# Patient Record
Sex: Female | Born: 1985
Health system: Southern US, Community
[De-identification: ages and names within clinical notes are randomized; demographics above are authoritative.]

## PROBLEM LIST (undated history)

## (undated) DIAGNOSIS — J45909 Unspecified asthma, uncomplicated: Secondary | ICD-10-CM

## (undated) DIAGNOSIS — B009 Herpesviral infection, unspecified: Secondary | ICD-10-CM

## (undated) DIAGNOSIS — I2699 Other pulmonary embolism without acute cor pulmonale: Secondary | ICD-10-CM

## (undated) DIAGNOSIS — O139 Gestational [pregnancy-induced] hypertension without significant proteinuria, unspecified trimester: Secondary | ICD-10-CM

## (undated) HISTORY — DX: Gestational (pregnancy-induced) hypertension without significant proteinuria, unspecified trimester: O13.9

## (undated) HISTORY — DX: Herpesviral infection, unspecified: B00.9

## (undated) HISTORY — PX: NO PAST SURGERIES: SHX2092

---

## 2007-08-10 ENCOUNTER — Emergency Department (HOSPITAL_COMMUNITY): Admission: EM | Admit: 2007-08-10 | Discharge: 2007-08-10 | Payer: Self-pay | Admitting: *Deleted

## 2007-11-08 ENCOUNTER — Emergency Department (HOSPITAL_COMMUNITY): Admission: EM | Admit: 2007-11-08 | Discharge: 2007-11-08 | Payer: Self-pay | Admitting: Emergency Medicine

## 2009-01-01 ENCOUNTER — Emergency Department (HOSPITAL_COMMUNITY): Admission: EM | Admit: 2009-01-01 | Discharge: 2009-01-02 | Payer: Self-pay | Admitting: Emergency Medicine

## 2009-11-11 ENCOUNTER — Emergency Department (HOSPITAL_COMMUNITY): Admission: EM | Admit: 2009-11-11 | Discharge: 2009-11-11 | Payer: Self-pay | Admitting: Emergency Medicine

## 2010-01-14 ENCOUNTER — Emergency Department (HOSPITAL_COMMUNITY): Admission: EM | Admit: 2010-01-14 | Discharge: 2010-01-14 | Payer: Self-pay | Admitting: Family Medicine

## 2010-03-25 ENCOUNTER — Emergency Department (HOSPITAL_COMMUNITY): Admission: EM | Admit: 2010-03-25 | Discharge: 2010-03-25 | Payer: Self-pay | Admitting: Emergency Medicine

## 2010-12-13 LAB — URINALYSIS, ROUTINE W REFLEX MICROSCOPIC
Bilirubin Urine: NEGATIVE
Glucose, UA: NEGATIVE mg/dL
Nitrite: NEGATIVE
Protein, ur: NEGATIVE mg/dL

## 2010-12-13 LAB — WET PREP, GENITAL: Trich, Wet Prep: NONE SEEN

## 2010-12-13 LAB — RPR: RPR Ser Ql: NONREACTIVE

## 2010-12-13 LAB — POCT PREGNANCY, URINE: Preg Test, Ur: NEGATIVE

## 2010-12-13 LAB — URINE MICROSCOPIC-ADD ON

## 2010-12-15 LAB — POCT URINALYSIS DIP (DEVICE)
Nitrite: POSITIVE — AB
Specific Gravity, Urine: >=1.03 (ref 1.005–1.030)
Urobilinogen, UA: 0.2 mg/dL (ref 0.0–1.0)
pH: 6 (ref 5.0–8.0)

## 2010-12-15 LAB — POCT PREGNANCY, URINE: Preg Test, Ur: NEGATIVE

## 2010-12-15 LAB — URINE CULTURE: Colony Count: >=100000

## 2012-02-16 ENCOUNTER — Other Ambulatory Visit (HOSPITAL_COMMUNITY)
Admission: RE | Admit: 2012-02-16 | Discharge: 2012-02-16 | Disposition: A | Discharge status: Home / Self Care | Payer: BC Managed Care – PPO | Source: Ambulatory Visit | Attending: Obstetrics & Gynecology | Admitting: Obstetrics & Gynecology

## 2012-02-16 ENCOUNTER — Other Ambulatory Visit: Payer: Self-pay | Admitting: Obstetrics & Gynecology

## 2012-02-16 DIAGNOSIS — Z113 Encounter for screening for infections with a predominantly sexual mode of transmission: Secondary | ICD-10-CM | POA: Insufficient documentation

## 2012-02-16 DIAGNOSIS — Z01419 Encounter for gynecological examination (general) (routine) without abnormal findings: Secondary | ICD-10-CM | POA: Insufficient documentation

## 2012-03-08 ENCOUNTER — Encounter: Payer: Self-pay | Admitting: Obstetrics & Gynecology

## 2012-08-16 ENCOUNTER — Encounter (HOSPITAL_BASED_OUTPATIENT_CLINIC_OR_DEPARTMENT_OTHER): Payer: Self-pay | Admitting: Emergency Medicine

## 2012-08-16 ENCOUNTER — Emergency Department (HOSPITAL_BASED_OUTPATIENT_CLINIC_OR_DEPARTMENT_OTHER)
Admission: EM | Admit: 2012-08-16 | Discharge: 2012-08-16 | Disposition: A | Discharge status: Home / Self Care | Payer: BC Managed Care – PPO | Attending: Emergency Medicine | Admitting: Emergency Medicine

## 2012-08-16 DIAGNOSIS — H5789 Other specified disorders of eye and adnexa: Secondary | ICD-10-CM | POA: Insufficient documentation

## 2012-08-16 DIAGNOSIS — R05 Cough: Secondary | ICD-10-CM | POA: Insufficient documentation

## 2012-08-16 DIAGNOSIS — R1110 Vomiting, unspecified: Secondary | ICD-10-CM | POA: Insufficient documentation

## 2012-08-16 DIAGNOSIS — R059 Cough, unspecified: Secondary | ICD-10-CM | POA: Insufficient documentation

## 2012-08-16 DIAGNOSIS — Z87891 Personal history of nicotine dependence: Secondary | ICD-10-CM | POA: Insufficient documentation

## 2012-08-16 DIAGNOSIS — R233 Spontaneous ecchymoses: Secondary | ICD-10-CM | POA: Insufficient documentation

## 2012-08-16 DIAGNOSIS — R509 Fever, unspecified: Secondary | ICD-10-CM | POA: Insufficient documentation

## 2012-08-16 DIAGNOSIS — J45909 Unspecified asthma, uncomplicated: Secondary | ICD-10-CM | POA: Insufficient documentation

## 2012-08-16 HISTORY — DX: Unspecified asthma, uncomplicated: J45.909

## 2012-08-16 NOTE — ED Provider Notes (Signed)
History     CSN: [id number]  Arrival date & time 08/16/12  1914   First MD Initiated Contact with Patient 08/16/12 1932      Chief Complaint  Patient presents with  . Eye Problem    (Consider location/radiation/quality/duration/timing/severity/associated sxs/prior treatment) HPI Pt reports last night she at some seafood and then had a short episode of vomiting which resolved before going to bed. She has had a mild fever today and persistent dry cough. Co-workers noticed some redness around her eyes bilaterally and recommended she come to the ED. She denies any pain, no drainage, no blurry vision. Other than the mild cough she has been feeling well today.   Past Medical History  Diagnosis Date  . Asthma     History reviewed. No pertinent past surgical history.  No family history on file.  History  Substance Use Topics  . Smoking status: Former Games developer  . Smokeless tobacco: Not on file  . Alcohol Use: Yes    OB History    Grav Para Term Preterm Abortions TAB SAB Ect Mult Living                  Review of Systems All other systems reviewed and are negative except as noted in HPI.   Allergies  Review of patient's allergies indicates no known allergies.  Home Medications  No current outpatient prescriptions on file.  BP 138/85  Pulse 93  Temp 99 F (37.2 C) (Oral)  Resp 16  Ht 5\' 2"  (1.575 m)  Wt 219 lb (99.338 kg)  BMI 40.06 kg/m2  SpO2 99%  LMP 08/14/2012  Physical Exam  Nursing note and vitals reviewed. Constitutional: She is oriented to person, place, and time. She appears well-developed and well-nourished.  HENT:  Head: Normocephalic and atraumatic.  Eyes: EOM are normal. Pupils are equal, round, and reactive to light.       Mild periorbital ecchymosis/petechiae bilaterally, normal globes, no conjunctivitis, anterior chambers are normal  Neck: Normal range of motion. Neck supple.  Cardiovascular: Normal rate, normal heart sounds and intact distal  pulses.   Pulmonary/Chest: Effort normal and breath sounds normal.  Abdominal: Bowel sounds are normal. She exhibits no distension. There is no tenderness.  Musculoskeletal: Normal range of motion. She exhibits no edema and no tenderness.  Neurological: She is alert and oriented to person, place, and time. She has normal strength. No cranial nerve deficit or sensory deficit.  Skin: Skin is warm and dry. No rash noted.  Psychiatric: She has a normal mood and affect.    ED Course  Procedures (including critical care time)  Labs Reviewed - No data to display No results found.   No diagnosis found.    MDM  Periorbital petechiae likely from vomiting and/or coughing. No concern for periorbital cellulitis, pt advised to return for any worsening redness, swelling, pain, or for any other concerns.         Hawkin Charo B. Bernette Mayers, MD 08/16/12 1941

## 2012-08-16 NOTE — ED Notes (Signed)
Pt sts her coworkers noticed her eyes looking red today.  Sts she got some delivery seafood last night and she vomited 5  Min after eating it.  No other vomiting or issues at that time.  Pt concerned about the redness in her eyes today.

## 2013-02-04 ENCOUNTER — Emergency Department (HOSPITAL_COMMUNITY)
Admission: EM | Admit: 2013-02-04 | Discharge: 2013-02-04 | Disposition: A | Discharge status: Home / Self Care | Payer: Self-pay | Attending: Emergency Medicine | Admitting: Emergency Medicine

## 2013-02-04 ENCOUNTER — Encounter (HOSPITAL_COMMUNITY): Payer: Self-pay | Admitting: *Deleted

## 2013-02-04 DIAGNOSIS — N760 Acute vaginitis: Secondary | ICD-10-CM | POA: Insufficient documentation

## 2013-02-04 DIAGNOSIS — Z79899 Other long term (current) drug therapy: Secondary | ICD-10-CM | POA: Insufficient documentation

## 2013-02-04 DIAGNOSIS — A499 Bacterial infection, unspecified: Secondary | ICD-10-CM | POA: Insufficient documentation

## 2013-02-04 DIAGNOSIS — R209 Unspecified disturbances of skin sensation: Secondary | ICD-10-CM | POA: Insufficient documentation

## 2013-02-04 DIAGNOSIS — Z87891 Personal history of nicotine dependence: Secondary | ICD-10-CM | POA: Insufficient documentation

## 2013-02-04 DIAGNOSIS — J45909 Unspecified asthma, uncomplicated: Secondary | ICD-10-CM | POA: Insufficient documentation

## 2013-02-04 DIAGNOSIS — Z3202 Encounter for pregnancy test, result negative: Secondary | ICD-10-CM | POA: Insufficient documentation

## 2013-02-04 DIAGNOSIS — B9689 Other specified bacterial agents as the cause of diseases classified elsewhere: Secondary | ICD-10-CM | POA: Insufficient documentation

## 2013-02-04 LAB — URINALYSIS, ROUTINE W REFLEX MICROSCOPIC
Bilirubin Urine: NEGATIVE
Glucose, UA: NEGATIVE mg/dL
Hgb urine dipstick: NEGATIVE
Leukocytes, UA: NEGATIVE

## 2013-02-04 LAB — WET PREP, GENITAL: Yeast Wet Prep HPF POC: NONE SEEN

## 2013-02-04 LAB — POCT PREGNANCY, URINE: Preg Test, Ur: NEGATIVE

## 2013-02-04 MED ORDER — METRONIDAZOLE 500 MG PO TABS: 500.0000 mg | ORAL_TABLET | Freq: Two times a day (BID) | ORAL | Status: DC

## 2013-02-04 NOTE — ED Notes (Signed)
Pt self treated with monistat for yeast infection.  Pt states that she took fluconazole, but continues to have vaginal tingling

## 2013-02-04 NOTE — ED Provider Notes (Signed)
History     CSN: [id number]  Arrival date & time 02/04/13  1404   First MD Initiated Contact with Patient 02/04/13 1503      Chief Complaint  Patient presents with  . Vaginal Discharge    (Consider location/radiation/quality/duration/timing/severity/associated sxs/prior treatment) HPI Comments: Patient presents with a chief complaint of vaginal discharge and tingling in the vaginal area.  She reports that her symptoms have been present for the past month.  Initially she had a "thick cottage cheese" like discharge.  She then took a 3 day course of OTC Monistat and feels that her symptoms improved somewhat.  However, she continues to have vaginal irritation and a "tingling" feeling.  She called her PCP two weeks ago and her PCP called her in a prescription for Fluconazole, which she has been taking for the past six days.  She is currently sexually active.  No new sexual partners.  No prior history of STD's.  She denies any abdominal or pelvic pain.  Denies nausea or vomiting.  Denies fever or chills.  LMP was 01/24/13.  Patient is a 27 y.o. female presenting with vaginal discharge. The history is provided by the patient.  Vaginal Discharge Pertinent negatives include no abdominal pain, chills, fever, nausea, rash, urinary symptoms or vomiting.    Past Medical History  Diagnosis Date  . Asthma     History reviewed. No pertinent past surgical history.  No family history on file.  History  Substance Use Topics  . Smoking status: Former Games developer  . Smokeless tobacco: Not on file  . Alcohol Use: Yes    OB History   Grav Para Term Preterm Abortions TAB SAB Ect Mult Living                  Review of Systems  Constitutional: Negative for fever and chills.  Gastrointestinal: Negative for nausea, vomiting and abdominal pain.  Genitourinary: Positive for vaginal discharge. Negative for vaginal bleeding and pelvic pain.  Skin: Negative for rash.  All other systems reviewed and are  negative.    Allergies  Review of patient's allergies indicates no known allergies.  Home Medications   Current Outpatient Rx  Name  Route  Sig  Dispense  Refill  . acetaminophen (TYLENOL) 500 MG tablet   Oral   Take 1,000 mg by mouth 2 (two) times daily as needed for pain.         . fluconazole (DIFLUCAN) 150 MG tablet   Oral   Take 150 mg by mouth daily. For 10 days, pt started on 01/29/13         . albuterol (PROVENTIL HFA;VENTOLIN HFA) 108 (90 BASE) MCG/ACT inhaler   Inhalation   Inhale 2 puffs into the lungs every 6 (six) hours as needed for wheezing or shortness of breath.           BP 160/95  Pulse 105  Temp(Src) 98.3 F (36.8 C) (Oral)  Resp 18  SpO2 100%  LMP 01/24/2013  Physical Exam  Nursing note and vitals reviewed. Constitutional: She appears well-developed and well-nourished.  HENT:  Head: Normocephalic and atraumatic.  Mouth/Throat: Oropharynx is clear and moist.  Neck: Normal range of motion. Neck supple.  Cardiovascular: Normal rate, regular rhythm and normal heart sounds.   Pulmonary/Chest: Effort normal and breath sounds normal.  Abdominal: Soft. Bowel sounds are normal. She exhibits no distension and no mass. There is no tenderness. There is no rebound and no guarding.  Genitourinary: There is no rash or  lesion on the right labia. There is no rash or lesion on the left labia. Cervix exhibits no motion tenderness. Right adnexum displays no mass, no tenderness and no fullness. Left adnexum displays no mass, no tenderness and no fullness.  Yellowish colored discharge in the vaginal vault  Neurological: She is alert.  Skin: Skin is warm and dry.  Psychiatric: She has a normal mood and affect.    ED Course  Procedures (including critical care time)  Labs Reviewed  GC/CHLAMYDIA PROBE AMP  WET PREP, GENITAL  URINALYSIS, ROUTINE W REFLEX MICROSCOPIC  POCT PREGNANCY, URINE   No results found.   No diagnosis found.    MDM  Patient  presenting with vaginal discharge.  No pelvic pain.  No CMT on exam.  Wet prep showing BV.  Patient treated with Flagyl.  GC/Chlamydia pending.  Urine pregnancy negative.  Patient stable for discharge.  Return precautions given.        Pascal Lux [place], PA-C 02/05/13 1217

## 2013-02-04 NOTE — ED Notes (Addendum)
Pt states she used monistat a few weeks ago for a yeast infection, now using flucanizole for yeast infection, but is still complaining of vaginal tingling and itching. Denies discharge. Denies pain. Denies n/v, fever. Denies urinary symptoms.

## 2013-02-06 NOTE — ED Provider Notes (Signed)
Medical screening examination/treatment/procedure(s) were performed by non-physician practitioner and as supervising physician I was immediately available for consultation/collaboration.   Chriss Redel, MD 02/06/13 1544 

## 2014-09-27 DIAGNOSIS — Z87891 Personal history of nicotine dependence: Secondary | ICD-10-CM | POA: Diagnosis not present

## 2014-09-27 DIAGNOSIS — Z3202 Encounter for pregnancy test, result negative: Secondary | ICD-10-CM | POA: Insufficient documentation

## 2014-09-27 DIAGNOSIS — R102 Pelvic and perineal pain: Secondary | ICD-10-CM | POA: Diagnosis not present

## 2014-09-27 DIAGNOSIS — M545 Low back pain: Secondary | ICD-10-CM | POA: Insufficient documentation

## 2014-09-27 DIAGNOSIS — R51 Headache: Secondary | ICD-10-CM | POA: Insufficient documentation

## 2014-09-27 DIAGNOSIS — J45909 Unspecified asthma, uncomplicated: Secondary | ICD-10-CM | POA: Diagnosis not present

## 2014-09-27 DIAGNOSIS — Z79899 Other long term (current) drug therapy: Secondary | ICD-10-CM | POA: Insufficient documentation

## 2014-09-28 ENCOUNTER — Encounter (HOSPITAL_COMMUNITY): Payer: Self-pay | Admitting: *Deleted

## 2014-09-28 ENCOUNTER — Emergency Department (HOSPITAL_COMMUNITY)
Admission: EM | Admit: 2014-09-28 | Discharge: 2014-09-28 | Disposition: A | Discharge status: Home / Self Care | Payer: BC Managed Care – PPO | Attending: Emergency Medicine | Admitting: Emergency Medicine

## 2014-09-28 DIAGNOSIS — R102 Pelvic and perineal pain: Secondary | ICD-10-CM

## 2014-09-28 LAB — URINALYSIS, ROUTINE W REFLEX MICROSCOPIC
BILIRUBIN URINE: NEGATIVE
Glucose, UA: NEGATIVE mg/dL
Hgb urine dipstick: NEGATIVE
KETONES UR: NEGATIVE mg/dL
LEUKOCYTES UA: NEGATIVE
NITRITE: NEGATIVE
Protein, ur: NEGATIVE mg/dL
Specific Gravity, Urine: 1.025 (ref 1.005–1.030)
Urobilinogen, UA: 1 mg/dL (ref 0.0–1.0)
pH: 7 (ref 5.0–8.0)

## 2014-09-28 LAB — POC URINE PREG, ED: PREG TEST UR: NEGATIVE

## 2014-09-28 LAB — WET PREP, GENITAL
Trich, Wet Prep: NONE SEEN
Yeast Wet Prep HPF POC: NONE SEEN

## 2014-09-28 MED ORDER — FLUCONAZOLE 100 MG PO TABS
150.0000 mg | ORAL_TABLET | Freq: Once | ORAL | Status: AC
  Administered 2014-09-28: 150 mg via ORAL
  Filled 2014-09-28: qty 2

## 2014-09-28 NOTE — ED Provider Notes (Signed)
CSN: [id number]     Arrival date & time 09/27/14  2348 History   First MD Initiated Contact with Patient 09/28/14 0121     This chart was scribed for Fredia Sorrow, MD by Forrestine Him, ED Scribe. This patient was seen in room D32C/D32C and the patient's care was started 5:10 AM.   Chief Complaint  Patient presents with  . Vaginal Pain   Patient is a 29 y.o. female presenting with vaginal pain. The history is provided by the patient. No language interpreter was used.  Vaginal Pain This is a new problem. The current episode started more than 1 week ago. The problem occurs daily. The problem has not changed since onset.Associated symptoms include headaches. Pertinent negatives include no chest pain, no abdominal pain and no shortness of breath. Nothing aggravates the symptoms. Nothing relieves the symptoms. She has tried nothing for the symptoms.    HPI Comments: Wanda Fuller is a 29 y.o. female with a PMHx of asthma who presents to the Emergency Department complaining of intermittent vaginal pain x 1 month that is unchanged. She describes pain as tingling to her vulva. No recent vaginal discharge,fever, dysuria,or chills. Pt has mentioned concern to PCP without any abnormal finds on exam. LNMP 09/14/2014. No known allergies to medications.  Past Medical History  Diagnosis Date  . Asthma    History reviewed. No pertinent past surgical history. No family history on file. History  Substance Use Topics  . Smoking status: Former Research scientist (life sciences)  . Smokeless tobacco: Never Used  . Alcohol Use: Yes   OB History    No data available     Review of Systems  Constitutional: Negative for fever and chills.  HENT: Negative for congestion, rhinorrhea and sore throat.   Eyes: Negative for visual disturbance.  Respiratory: Negative for cough and shortness of breath.   Cardiovascular: Negative for chest pain and leg swelling.  Gastrointestinal: Negative for nausea, vomiting and abdominal pain.   Genitourinary: Positive for vaginal pain. Negative for vaginal discharge.  Musculoskeletal: Positive for back pain.  Skin: Negative for rash.  Neurological: Positive for headaches.  Psychiatric/Behavioral: Negative for confusion.      Allergies  Review of patient's allergies indicates no known allergies.  Home Medications   Prior to Admission medications   Medication Sig Start Date End Date Taking? Authorizing Provider  acetaminophen (TYLENOL) 500 MG tablet Take 1,000 mg by mouth 2 (two) times daily as needed for pain.    Historical Provider, MD  albuterol (PROVENTIL HFA;VENTOLIN HFA) 108 (90 BASE) MCG/ACT inhaler Inhale 2 puffs into the lungs every 6 (six) hours as needed for wheezing or shortness of breath.    Historical Provider, MD  fluconazole (DIFLUCAN) 150 MG tablet Take 150 mg by mouth daily. For 10 days, pt started on 01/29/13    Historical Provider, MD  metroNIDAZOLE (FLAGYL) 500 MG tablet Take 1 tablet (500 mg total) by mouth 2 (two) times daily. Patient not taking: Reported on 09/28/2014 02/04/13   Hyman Bible, PA-C   Triage Vitals: BP 121/72 mmHg  Pulse 82  Temp(Src) 98.2 F (36.8 C) (Oral)  Resp 16  Ht 5\' 2"  (1.575 m)  Wt 225 lb (102.059 kg)  BMI 41.14 kg/m2  SpO2 100%  LMP 09/14/2014   Physical Exam  Constitutional: She is oriented to person, place, and time. She appears well-developed and well-nourished. No distress.  HENT:  Head: Normocephalic and atraumatic.  Mouth/Throat: Oropharynx is clear and moist.  Eyes: Conjunctivae and EOM are normal. Pupils  are equal, round, and reactive to light.  Sclera clear  Neck: Normal range of motion.  Cardiovascular: Normal rate, regular rhythm and normal heart sounds.   No murmur heard. Pulmonary/Chest: Effort normal and breath sounds normal.  Abdominal: Soft. She exhibits no distension. There is no tenderness.  Genitourinary: Uterus normal. Vaginal discharge found.  Whitish vaginal discharge consistent with yeast  infection. No cervical motion tenderness. No external vulvar abnormalities. No lesions no irritation.  Musculoskeletal: Normal range of motion. She exhibits no edema.  Neurological: She is alert and oriented to person, place, and time. No cranial nerve deficit. She exhibits normal muscle tone. Coordination normal.  Skin: Skin is warm and dry.  Psychiatric: She has a normal mood and affect. Judgment normal.  Nursing note and vitals reviewed.   ED Course  Procedures (including critical care time)  DIAGNOSTIC STUDIES: Oxygen Saturation is 100% on RA, Normal by my interpretation.    COORDINATION OF CARE: 5:10 AM-Discussed treatment plan with pt at bedside and pt agreed to plan.     Labs Review Labs Reviewed  WET PREP, GENITAL - Abnormal; Notable for the following:    Clue Cells Wet Prep HPF POC FEW (*)    WBC, Wet Prep HPF POC FEW (*)    All other components within normal limits  URINALYSIS, ROUTINE W REFLEX MICROSCOPIC - Abnormal; Notable for the following:    APPearance CLOUDY (*)    All other components within normal limits  GC/CHLAMYDIA PROBE AMP  HIV ANTIBODY (ROUTINE TESTING)  POC URINE PREG, ED   Results for orders placed or performed during the hospital encounter of 09/28/14  Wet prep, genital  Result Value Ref Range   Yeast Wet Prep HPF POC NONE SEEN NONE SEEN   Trich, Wet Prep NONE SEEN NONE SEEN   Clue Cells Wet Prep HPF POC FEW (A) NONE SEEN   WBC, Wet Prep HPF POC FEW (A) NONE SEEN  Urinalysis, Routine w reflex microscopic  Result Value Ref Range   Color, Urine YELLOW YELLOW   APPearance CLOUDY (A) CLEAR   Specific Gravity, Urine 1.025 1.005 - 1.030   pH 7.0 5.0 - 8.0   Glucose, UA NEGATIVE NEGATIVE mg/dL   Hgb urine dipstick NEGATIVE NEGATIVE   Bilirubin Urine NEGATIVE NEGATIVE   Ketones, ur NEGATIVE NEGATIVE mg/dL   Protein, ur NEGATIVE NEGATIVE mg/dL   Urobilinogen, UA 1.0 0.0 - 1.0 mg/dL   Nitrite NEGATIVE NEGATIVE   Leukocytes, UA NEGATIVE NEGATIVE   POC Urine Pregnancy, ED (do NOT order at [facility])  Result Value Ref Range   Preg Test, Ur NEGATIVE NEGATIVE     Imaging Review No results found.   EKG Interpretation None      MDM   Final diagnoses:  Vaginal pain    No explanation for the patient's complaint of pelvic discomfort. Pelvic exam showed no abnormalities. There was a whitish discharge consistent with yeast however wet prep was negative. Patient given Diflucan here in the emergency department one time dose. Patient will follow-up with her doctor. Patient is nontoxic no acute distress. Pelvic cultures are pending. There was no uterine or adnexal tenderness.  I personally performed the services described in this documentation, which was scribed in my presence. The recorded information has been reviewed and is accurate.    Fredia Sorrow, MD 09/28/14 [phone]

## 2014-09-28 NOTE — ED Notes (Signed)
Patient presents stating she has been having a "strange feeling"   To her vulva and clit.  Does not know if i could be from over use of a vibrator

## 2014-09-28 NOTE — Discharge Instructions (Signed)
Diflucan provided in the emergency department for discharge to treat any possible yeast infection. Cultures are pending. Pelvic exam without any explanation for the pain. Follow-up with your doctor.

## 2014-09-29 LAB — HIV ANTIBODY (ROUTINE TESTING W REFLEX): HIV 1&2 Ab, 4th Generation: NONREACTIVE

## 2014-10-02 LAB — GC/CHLAMYDIA PROBE AMP
CT Probe RNA: NEGATIVE
GC Probe RNA: NEGATIVE

## 2016-09-17 ENCOUNTER — Encounter (HOSPITAL_BASED_OUTPATIENT_CLINIC_OR_DEPARTMENT_OTHER): Payer: Self-pay | Admitting: *Deleted

## 2016-09-17 ENCOUNTER — Emergency Department (HOSPITAL_BASED_OUTPATIENT_CLINIC_OR_DEPARTMENT_OTHER): Payer: BLUE CROSS/BLUE SHIELD

## 2016-09-17 ENCOUNTER — Inpatient Hospital Stay (HOSPITAL_BASED_OUTPATIENT_CLINIC_OR_DEPARTMENT_OTHER)
Admission: EM | Admit: 2016-09-17 | Discharge: 2016-09-19 | DRG: 175 | Disposition: A | Discharge status: Home / Self Care | Payer: BLUE CROSS/BLUE SHIELD | Attending: Internal Medicine | Admitting: Internal Medicine

## 2016-09-17 DIAGNOSIS — J189 Pneumonia, unspecified organism: Secondary | ICD-10-CM | POA: Diagnosis present

## 2016-09-17 DIAGNOSIS — Z6841 Body Mass Index (BMI) 40.0 and over, adult: Secondary | ICD-10-CM

## 2016-09-17 DIAGNOSIS — D1803 Hemangioma of intra-abdominal structures: Secondary | ICD-10-CM | POA: Diagnosis present

## 2016-09-17 DIAGNOSIS — I2699 Other pulmonary embolism without acute cor pulmonale: Principal | ICD-10-CM | POA: Diagnosis present

## 2016-09-17 DIAGNOSIS — M7989 Other specified soft tissue disorders: Secondary | ICD-10-CM | POA: Diagnosis not present

## 2016-09-17 DIAGNOSIS — R0600 Dyspnea, unspecified: Secondary | ICD-10-CM | POA: Diagnosis not present

## 2016-09-17 DIAGNOSIS — J181 Lobar pneumonia, unspecified organism: Secondary | ICD-10-CM

## 2016-09-17 DIAGNOSIS — D1809 Hemangioma of other sites: Secondary | ICD-10-CM | POA: Diagnosis present

## 2016-09-17 DIAGNOSIS — J45909 Unspecified asthma, uncomplicated: Secondary | ICD-10-CM | POA: Diagnosis present

## 2016-09-17 DIAGNOSIS — Z87891 Personal history of nicotine dependence: Secondary | ICD-10-CM

## 2016-09-17 LAB — CBC
HCT: 38.7 % (ref 36.0–46.0)
Hemoglobin: 12.8 g/dL (ref 12.0–15.0)
MCH: 30.2 pg (ref 26.0–34.0)
MCHC: 33.1 g/dL (ref 30.0–36.0)
MCV: 91.3 fL (ref 78.0–100.0)
Platelets: 223 10*3/uL (ref 150–400)
RBC: 4.24 MIL/uL (ref 3.87–5.11)
RDW: 13.4 % (ref 11.5–15.5)
WBC: 9.4 10*3/uL (ref 4.0–10.5)

## 2016-09-17 LAB — BASIC METABOLIC PANEL
Anion gap: 8 (ref 5–15)
BUN: 10 mg/dL (ref 6–20)
CHLORIDE: 103 mmol/L (ref 101–111)
CO2: 25 mmol/L (ref 22–32)
Calcium: 9.9 mg/dL (ref 8.9–10.3)
Creatinine, Ser: 0.65 mg/dL (ref 0.44–1.00)
GFR calc Af Amer: >60 mL/min (ref >60)
GFR calc non Af Amer: >60 mL/min (ref >60)
GLUCOSE: 93 mg/dL (ref 65–99)
POTASSIUM: 3.6 mmol/L (ref 3.5–5.1)
Sodium: 136 mmol/L (ref 135–145)

## 2016-09-17 LAB — TROPONIN I: Troponin I: <0.03 ng/mL (ref <0.03)

## 2016-09-17 LAB — D-DIMER, QUANTITATIVE: D-Dimer, Quant: 1.08 ug/mL-FEU — ABNORMAL HIGH (ref 0.00–0.50)

## 2016-09-17 LAB — HEPARIN LEVEL (UNFRACTIONATED): Heparin Unfractionated: 0.37 IU/mL (ref 0.30–0.70)

## 2016-09-17 MED ORDER — HYDROMORPHONE HCL 1 MG/ML IJ SOLN
1.0000 mg | INTRAMUSCULAR | Status: AC | PRN
  Administered 2016-09-17 – 2016-09-18 (×3): 1 mg via INTRAVENOUS
  Filled 2016-09-17 (×3): qty 1

## 2016-09-17 MED ORDER — IOPAMIDOL (ISOVUE-370) INJECTION 76%
100.0000 mL | Freq: Once | INTRAVENOUS | Status: AC | PRN
  Administered 2016-09-17: 100 mL via INTRAVENOUS

## 2016-09-17 MED ORDER — AZITHROMYCIN 500 MG IV SOLR
INTRAVENOUS | Status: AC
  Filled 2016-09-17: qty 500

## 2016-09-17 MED ORDER — HEPARIN (PORCINE) IN NACL 100-0.45 UNIT/ML-% IJ SOLN
1550.0000 [IU]/h | INTRAMUSCULAR | Status: DC
  Administered 2016-09-17: 1300 [IU]/h via INTRAVENOUS
  Administered 2016-09-18: 1550 [IU]/h via INTRAVENOUS
  Filled 2016-09-17 (×2): qty 250

## 2016-09-17 MED ORDER — HYDROCODONE-ACETAMINOPHEN 5-325 MG PO TABS
1.0000 | ORAL_TABLET | Freq: Four times a day (QID) | ORAL | Status: DC | PRN
  Administered 2016-09-18 – 2016-09-19 (×6): 1 via ORAL
  Filled 2016-09-17 (×7): qty 1

## 2016-09-17 MED ORDER — ONDANSETRON HCL 4 MG PO TABS: 4.0000 mg | ORAL_TABLET | Freq: Four times a day (QID) | ORAL | Status: DC | PRN

## 2016-09-17 MED ORDER — ACETAMINOPHEN 650 MG RE SUPP: 650.0000 mg | Freq: Four times a day (QID) | RECTAL | Status: DC | PRN

## 2016-09-17 MED ORDER — WHITE PETROLATUM GEL
Status: AC
  Administered 2016-09-18: 1
  Filled 2016-09-17: qty 1

## 2016-09-17 MED ORDER — DEXTROSE 5 % IV SOLN
500.0000 mg | Freq: Once | INTRAVENOUS | Status: AC
  Administered 2016-09-17: 500 mg via INTRAVENOUS

## 2016-09-17 MED ORDER — SODIUM CHLORIDE 0.9 % IV BOLUS (SEPSIS)
1000.0000 mL | Freq: Once | INTRAVENOUS | Status: AC
  Administered 2016-09-17: 1000 mL via INTRAVENOUS

## 2016-09-17 MED ORDER — IPRATROPIUM-ALBUTEROL 0.5-2.5 (3) MG/3ML IN SOLN: 3.0000 mL | RESPIRATORY_TRACT | Status: DC | PRN

## 2016-09-17 MED ORDER — ONDANSETRON HCL 4 MG/2ML IJ SOLN
4.0000 mg | Freq: Four times a day (QID) | INTRAMUSCULAR | Status: DC | PRN
  Administered 2016-09-17: 4 mg via INTRAVENOUS
  Filled 2016-09-17: qty 2

## 2016-09-17 MED ORDER — HEPARIN BOLUS VIA INFUSION
5000.0000 [IU] | Freq: Once | INTRAVENOUS | Status: AC
  Administered 2016-09-17: 5000 [IU] via INTRAVENOUS

## 2016-09-17 MED ORDER — HYDROMORPHONE HCL 1 MG/ML IJ SOLN
1.0000 mg | Freq: Once | INTRAMUSCULAR | Status: AC
  Administered 2016-09-17: 1 mg via INTRAVENOUS
  Filled 2016-09-17: qty 1

## 2016-09-17 MED ORDER — AZITHROMYCIN 500 MG IV SOLR
500.0000 mg | INTRAVENOUS | Status: DC
  Administered 2016-09-18: 500 mg via INTRAVENOUS
  Filled 2016-09-17 (×2): qty 500

## 2016-09-17 MED ORDER — CEFTRIAXONE SODIUM 1 G IJ SOLR
1.0000 g | Freq: Once | INTRAMUSCULAR | Status: AC
  Administered 2016-09-17: 1 g via INTRAVENOUS
  Filled 2016-09-17: qty 10

## 2016-09-17 MED ORDER — KETOROLAC TROMETHAMINE 30 MG/ML IJ SOLN: 30.0000 mg | Freq: Four times a day (QID) | INTRAMUSCULAR | Status: DC | PRN

## 2016-09-17 MED ORDER — ACETAMINOPHEN 325 MG PO TABS
650.0000 mg | ORAL_TABLET | Freq: Four times a day (QID) | ORAL | Status: DC | PRN
  Administered 2016-09-18 – 2016-09-19 (×3): 650 mg via ORAL
  Filled 2016-09-17 (×3): qty 2

## 2016-09-17 MED ORDER — DEXTROSE 5 % IV SOLN
1.0000 g | INTRAVENOUS | Status: DC
  Administered 2016-09-18: 1 g via INTRAVENOUS
  Filled 2016-09-17 (×2): qty 10

## 2016-09-17 MED ORDER — FENTANYL CITRATE (PF) 100 MCG/2ML IJ SOLN
50.0000 ug | INTRAMUSCULAR | Status: DC | PRN
  Administered 2016-09-17: 50 ug via INTRAVENOUS
  Filled 2016-09-17: qty 2

## 2016-09-17 NOTE — ED Triage Notes (Addendum)
Pt c/o chest pain and SOB x 2 days, pt hyperventilating in triage and appears anxious

## 2016-09-17 NOTE — Progress Notes (Signed)
Presents with SOB and CP , has elevated D dimers, recent flight to [place], CTA significant for PE and pneumonia, started on heparin, and abx, reports left calf pain, will need doppler, accepted observation to telemetry. Phillips Climes MD

## 2016-09-17 NOTE — Progress Notes (Addendum)
ANTICOAGULATION CONSULT NOTE - Follow Up Consult  Pharmacy Consult for Heparin  Indication: pulmonary embolus  No Known Allergies  Patient Measurements: Height: 5\' 2"  (157.5 cm) Weight: 240 lb (108.9 kg) IBW/kg (Calculated) : 50.1  Vital Signs: Temp: 98.8 F (37.1 C) (12/22 2044) Temp Source: Oral (12/22 2044) BP: 131/92 (12/22 2044) Pulse Rate: 100 (12/22 2044)  Labs:  Recent Labs  09/17/16 1431 09/17/16 1437 09/17/16 2025 09/17/16 2243  HGB 12.8  --   --   --   HCT 38.7  --   --   --   PLT 223  --   --   --   HEPARINUNFRC  --   --   --  0.37  CREATININE 0.65  --   --   --   TROPONINI  --  <0.03 <0.03  --     Estimated Creatinine Clearance: 120.6 mL/min (by C-G formula based on SCr of 0.65 mg/dL).   Assessment: 30 y/o F with new onset PE, initial heparin level is therapeutic at 0.37  Goal of Therapy:  Heparin level 0.3-0.7 units/ml Monitor platelets by anticoagulation protocol: Yes   Plan:  -Heparin  -Inc heparin to 1400 units/hr to prevent sub-therapeutic level in setting on PE -Confirmatory HL with AM labs  Narda Bonds 09/17/2016,11:33 PM   =========================== Addendum 3:39 AM 09/18/2016  Heparin level dropped below therapeutic range despite rate increase -Heparin 2000 units BOLUS -Inc heparin drip to 1550 units/hr -1200 HL  Narda Bonds, PharmD, BCPS Clinical Pharmacist Phone: [phone] ============================

## 2016-09-17 NOTE — H&P (Signed)
History and Physical    Annahy Charlie [id number] DOB: 03-17-1986 DOA: 09/17/2016  Referring MD/NP/PA: Dr. Phillips Climes PCP: Windell Hummingbird, PA-C  Patient coming from: Transfer from [facility]  Chief Complaint: Chest pain  HPI: Wanda Fuller is a 30 y.o. female with medical history significant of asthma; who presents with complaints of a 2 day history of left-sided chest pain. Pain is sharp with radiation to the back and left arm. Tried utilizing ibuprofen for pain Sh without significant relief of symptoms Symptoms worsened by deep inspiration. Associated symptoms include orthopnea, nausea, vomiting, possibly low-grade fever, minimal cough, and left leg swelling.  She felt as though her left leg was just tight a couple days ago.Patient recently went on a trip to [place] December 7 and came back approximately 11 days ago. Denies having any personal or family history bleeding disorder, malignancy, or previous history of clots.  ED Course: Upon admission into the emergency department patient was seen to be afebrile, heart rates up to 125, respirations of 30, blood pressure as high as 161/103, and oxygen saturation maintained on room air. Lab work was unremarkable except for elevated d-dimer of 1.08. Chest x-ray shows signs of possible infiltrate. CT angiogram was performed of the chest which showed multiple right lower lobe pulmonary emboli and left lower infiltrate.  Review of Systems: As per HPI otherwise 10 point review of systems negative.   Past Medical History:  Diagnosis Date  . Asthma     History reviewed. No pertinent surgical history.   reports that she has quit smoking. She has never used smokeless tobacco. She reports that she drinks alcohol. She reports that she does not use drugs.  No Known Allergies  History reviewed. No pertinent family history.  Prior to Admission medications   Medication Sig Start Date End Date Taking? Authorizing Provider  acetaminophen  (TYLENOL) 500 MG tablet Take 1,000 mg by mouth 2 (two) times daily as needed for pain.    Historical Provider, MD    Physical Exam:    Constitutional: Young female in mild distress, able to follow commands, nontoxic in appearance. Vitals:   09/17/16 1645 09/17/16 1700 09/17/16 1730 09/17/16 1848  BP: (!) 161/103 151/97 (!) 129/103 (!) 140/92  Pulse:  108 110 (!) 104  Resp: 25 24 21    Temp:      TempSrc:      SpO2:  96% 99% 95%  Weight:      Height:       Eyes: PERRL, lids and conjunctivae normal ENMT: Mucous membranes are moist. Posterior pharynx clear of any exudate or lesions. Normal dentition.  Neck: normal, supple, no masses, no thyromegaly Respiratory: Tachypneic with decreased aeration, no wheezing, no crackles. Patient able to talk and nearly complete sentences. Cardiac: Tachycardic, no murmurs / rubs / gallop. Trace lower extremity edema. 2+ pedal pulses. No carotid bruits.  Abdomen: no tenderness, no masses palpated. No hepatosplenomegaly. Bowel sounds positive.  Musculoskeletal: no clubbing / cyanosis. No joint deformity upper and lower extremities. Good ROM, no contractures. Normal muscle tone.  Skin: no rashes, lesions, ulcers. No induration Neurologic: CN 2-12 grossly intact. Sensation intact, DTR normal. Strength 5/5 in all 4.  Psychiatric: Normal judgment and insight. Alert and oriented x 3. Normal mood.     Labs on Admission: I have personally reviewed following labs and imaging studies  CBC:  Recent Labs Lab 09/17/16 1431  WBC 9.4  HGB 12.8  HCT 38.7  MCV 91.3  PLT [id number]   Basic Metabolic Panel:  Recent Labs Lab 09/17/16 1431  NA 136  K 3.6  CL 103  CO2 25  GLUCOSE 93  BUN 10  CREATININE 0.65  CALCIUM 9.9   GFR: Estimated Creatinine Clearance: 120.6 mL/min (by C-G formula based on SCr of 0.65 mg/dL). Liver Function Tests: No results for input(s): AST, ALT, ALKPHOS, BILITOT, PROT, ALBUMIN in the last 168 hours. No results for input(s):  LIPASE, AMYLASE in the last 168 hours. No results for input(s): AMMONIA in the last 168 hours. Coagulation Profile: No results for input(s): INR, PROTIME in the last 168 hours. Cardiac Enzymes:  Recent Labs Lab 09/17/16 1437  TROPONINI <0.03   BNP (last 3 results) No results for input(s): PROBNP in the last 8760 hours. HbA1C: No results for input(s): HGBA1C in the last 72 hours. CBG: No results for input(s): GLUCAP in the last 168 hours. Lipid Profile: No results for input(s): CHOL, HDL, LDLCALC, TRIG, CHOLHDL, LDLDIRECT in the last 72 hours. Thyroid Function Tests: No results for input(s): TSH, T4TOTAL, FREET4, T3FREE, THYROIDAB in the last 72 hours. Anemia Panel: No results for input(s): VITAMINB12, FOLATE, FERRITIN, TIBC, IRON, RETICCTPCT in the last 72 hours. Urine analysis:    Component Value Date/Time   COLORURINE YELLOW 09/28/2014 0139   APPEARANCEUR CLOUDY (A) 09/28/2014 0139   LABSPEC 1.025 09/28/2014 0139   PHURINE 7.0 09/28/2014 0139   GLUCOSEU NEGATIVE 09/28/2014 0139   HGBUR NEGATIVE 09/28/2014 0139   BILIRUBINUR NEGATIVE 09/28/2014 0139   KETONESUR NEGATIVE 09/28/2014 0139   PROTEINUR NEGATIVE 09/28/2014 0139   UROBILINOGEN 1.0 09/28/2014 0139   NITRITE NEGATIVE 09/28/2014 0139   LEUKOCYTESUR NEGATIVE 09/28/2014 0139   Sepsis Labs: No results found for this or any previous visit (from the past 240 hour(s)).   Radiological Exams on Admission: Dg Chest 2 View  Result Date: 09/17/2016 CLINICAL DATA:  pain in her left chest and into her left arm. Reports that the pain is like a "spasm" pain which at times is more intense and caused her to cry out in pain. Patient reports that SOB and increased pain with inspiration. The patient traveled to [place] a few weeks ago. During xray- pt having difficulty taking a deep breath- stating that it hurts to breathe deeply EXAM: CHEST - 2 VIEW COMPARISON:  11/11/2009 FINDINGS: Low lung volumes with some subsegmental  atelectasis versus early infiltrate at the left lung base. Right lung clear. Heart size upper limits normal for technique. No effusion. Visualized bones unremarkable. IMPRESSION: 1. Low lung volumes. Patchy infiltrate or atelectasis, left lung base. Electronically Signed   By: Lucrezia Europe M.D.   On: 09/17/2016 14:57   Ct Angio Chest Pe W/cm &/or Wo Cm  Result Date: 09/17/2016 CLINICAL DATA:  Shortness of breath and chest pain EXAM: CT ANGIOGRAPHY CHEST WITH CONTRAST TECHNIQUE: Multidetector CT imaging of the chest was performed using the standard protocol during bolus administration of intravenous contrast. Multiplanar CT image reconstructions and MIPs were obtained to evaluate the vascular anatomy. CONTRAST:  100 mL Isovue 370 nonionic COMPARISON:  Chest radiograph September 17, 2016 FINDINGS: Cardiovascular: There are pulmonary emboli in multiple branches of the right lower lobe pulmonary artery. No more central pulmonary emboli are evident. There is no appreciable right heart strain ; the right ventricle to left ventricular diameter ratio is well less than 0.9. There is no thoracic aortic aneurysm or dissection. Visualized great vessels appear unremarkable. Pericardium is not appreciably thickened. Mediastinum/Nodes: Visualized thyroid appears unremarkable. There is no appreciable thoracic adenopathy. Lungs/Pleura: There is a  small left pleural effusion with patchy airspace consolidation in portions of the left lower lobe. There is slight atelectasis in the right middle lobe. Lungs elsewhere are clear. Upper Abdomen: Visualized upper abdominal structures appear unremarkable with the exception of an apparent hemangioma in the anterior segment right lobe of the liver medially measuring 2 2.7 x 2.0 x 1.4 cm. Musculoskeletal: There are no blastic or lytic bone lesions. Review of the MIP images confirms the above findings. IMPRESSION: Several right lower lobe distribution pulmonary emboli. No right heart strain.  Patchy infiltrate left lower lobe with small left pleural effusion. Mild right middle lobe atelectasis. Hemangioma in right lobe of liver. No evident adenopathy. Critical Value/emergent results were called by telephone at the time of interpretation on 09/17/2016 at 3:47 pm to Dr. Sherwood Gambler , who verbally acknowledged these results. Electronically Signed   By: Lowella Grip III M.D.   On: 09/17/2016 15:47    EKG: Independently reviewed. Sinus tachycardia   Assessment/Plan Pulmonary embolism: Acute. Pulmonary embolus seen on CT angiogram of the chest after elevated d-dimer obtained. Risk factors include trip to [place]. - Admit to a telemetry bed - Heparin drip per pharmacy - Check echocardiogram and Doppler ultrasound of lower extremity - Will need to transition to oral anticoagulant  Community-acquired pneumonia: Acute. Seen on imaging. - Continue empiric antibiotics of azithromycin and ceftriaxone - Follow-up cultures   Dyspnea: 2. Likely secondary to above  - Continuous pulse oximetry with nasal cannula oxygen to keep O2 saturation greater than 92% - DuoNeb's prn SOB/Wheezing  Left calf swelling: Acute. Following recent trip to [place]. Question possible clot in left leg. - Follow-up lower extremity Doppler  Hemangioma of the liver: Incidental finding on CT angiogram  DVT prophylaxis: Heparin   Code Status: Full code Family Communication: Discussed splenic care with the patient (present at bedside Disposition Plan: Likely discharge home once medically stable Consults called: None Admission status: Observation  Norval Morton MD [facility] Pager [phone]  If 7PM-7AM, please contact night-coverage [email or web address] Password [email or web address]  09/17/2016, 7:30 PM

## 2016-09-17 NOTE — ED Provider Notes (Signed)
[place] DEPT [facility] Provider Note   CSN: [id number] Arrival date & time: 09/17/16  1240     History   Chief Complaint Chief Complaint  Patient presents with  . Chest Pain    HPI Wanda Fuller is a 30 y.o. female.  HPI  30 year old female with a history of asthma presents with left-sided chest pain. States it started yesterday but has worsened since last night. It feels like a sharp stabbing pain. Significant worse with inspiration. There is some shortness of breath on exertion as well. Traveled to and from [place] recently, coming back about 11 days ago. Denies any fevers or cough. The pain is under her left breast as well as in her left back. She denies any recent leg swelling area and denies any current leg pain or calf pain but states that she felt like her calf was tight a couple days ago on her left. Tried ibuprofen without relief.  Past Medical History:  Diagnosis Date  . Asthma     Patient Active Problem List   Diagnosis Date Noted  . Pulmonary embolism ([place]) 09/17/2016    History reviewed. No pertinent surgical history.  OB History    No data available       Home Medications    Prior to Admission medications   Medication Sig Start Date End Date Taking? Authorizing Provider  acetaminophen (TYLENOL) 500 MG tablet Take 1,000 mg by mouth 2 (two) times daily as needed for pain.    Historical Provider, MD    Family History History reviewed. No pertinent family history.  Social History Social History  Substance Use Topics  . Smoking status: Former Research scientist (life sciences)  . Smokeless tobacco: Never Used  . Alcohol use Yes     Allergies   Patient has no known allergies.   Review of Systems Review of Systems  Constitutional: Negative for fever.  Respiratory: Positive for shortness of breath. Negative for cough.   Cardiovascular: Positive for chest pain. Negative for leg swelling.  Gastrointestinal: Negative for abdominal pain, nausea and vomiting.    Musculoskeletal: Positive for back pain.  All other systems reviewed and are negative.    Physical Exam Updated Vital Signs BP 139/99   Pulse 110   Temp 99.5 F (37.5 C) (Oral)   Resp (!) 30   Ht 5\' 2"  (1.575 m)   Wt 240 lb (108.9 kg)   LMP 09/02/2016   SpO2 99%   BMI 43.90 kg/m   Physical Exam  Constitutional: She is oriented to person, place, and time. She appears well-developed and well-nourished. She appears distressed (appears in significant pain).  HENT:  Head: Normocephalic and atraumatic.  Right Ear: External ear normal.  Left Ear: External ear normal.  Nose: Nose normal.  Eyes: Right eye exhibits no discharge. Left eye exhibits no discharge.  Cardiovascular: Regular rhythm and normal heart sounds.  Tachycardia present.   Pulmonary/Chest: Breath sounds normal. No accessory muscle usage. Tachypnea noted. She exhibits no tenderness.  Splinting due to the pain, taking frequent shallow respirations  Abdominal: Soft. There is no tenderness.  Musculoskeletal: She exhibits no edema.  Neurological: She is alert and oriented to person, place, and time.  Skin: Skin is warm and dry. She is not diaphoretic.  Nursing note and vitals reviewed.    ED Treatments / Results  Labs (all labs ordered are listed, but only abnormal results are displayed) Labs Reviewed  D-DIMER, QUANTITATIVE (NOT AT [facility]) - Abnormal; Notable for the following:  Result Value   D-Dimer, Quant 1.08 (*)    All other components within normal limits  CULTURE, BLOOD (ROUTINE X 2)  CULTURE, BLOOD (ROUTINE X 2)  BASIC METABOLIC PANEL  CBC  TROPONIN I    EKG  EKG Interpretation  Date/Time:  Friday September 17 2016 13:10:40 EST Ventricular Rate:  109 PR Interval:  154 QRS Duration: 84 QT Interval:  330 QTC Calculation: 444 R Axis:   -107 Text Interpretation:  Sinus tachycardia Possible Left atrial enlargement Right superior axis deviation Nonspecific T wave abnormality Abnormal ECG No old  tracing to compare Confirmed by Hastings MD, Taira Knabe [phone]) on 09/17/2016 3:03:14 PM       Radiology Dg Chest 2 View  Result Date: 09/17/2016 CLINICAL DATA:  pain in her left chest and into her left arm. Reports that the pain is like a "spasm" pain which at times is more intense and caused her to cry out in pain. Patient reports that SOB and increased pain with inspiration. The patient traveled to [place] a few weeks ago. During xray- pt having difficulty taking a deep breath- stating that it hurts to breathe deeply EXAM: CHEST - 2 VIEW COMPARISON:  11/11/2009 FINDINGS: Low lung volumes with some subsegmental atelectasis versus early infiltrate at the left lung base. Right lung clear. Heart size upper limits normal for technique. No effusion. Visualized bones unremarkable. IMPRESSION: 1. Low lung volumes. Patchy infiltrate or atelectasis, left lung base. Electronically Signed   By: Lucrezia Europe M.D.   On: 09/17/2016 14:57   Ct Angio Chest Pe W/cm &/or Wo Cm  Result Date: 09/17/2016 CLINICAL DATA:  Shortness of breath and chest pain EXAM: CT ANGIOGRAPHY CHEST WITH CONTRAST TECHNIQUE: Multidetector CT imaging of the chest was performed using the standard protocol during bolus administration of intravenous contrast. Multiplanar CT image reconstructions and MIPs were obtained to evaluate the vascular anatomy. CONTRAST:  100 mL Isovue 370 nonionic COMPARISON:  Chest radiograph September 17, 2016 FINDINGS: Cardiovascular: There are pulmonary emboli in multiple branches of the right lower lobe pulmonary artery. No more central pulmonary emboli are evident. There is no appreciable right heart strain ; the right ventricle to left ventricular diameter ratio is well less than 0.9. There is no thoracic aortic aneurysm or dissection. Visualized great vessels appear unremarkable. Pericardium is not appreciably thickened. Mediastinum/Nodes: Visualized thyroid appears unremarkable. There is no appreciable thoracic  adenopathy. Lungs/Pleura: There is a small left pleural effusion with patchy airspace consolidation in portions of the left lower lobe. There is slight atelectasis in the right middle lobe. Lungs elsewhere are clear. Upper Abdomen: Visualized upper abdominal structures appear unremarkable with the exception of an apparent hemangioma in the anterior segment right lobe of the liver medially measuring 2 2.7 x 2.0 x 1.4 cm. Musculoskeletal: There are no blastic or lytic bone lesions. Review of the MIP images confirms the above findings. IMPRESSION: Several right lower lobe distribution pulmonary emboli. No right heart strain. Patchy infiltrate left lower lobe with small left pleural effusion. Mild right middle lobe atelectasis. Hemangioma in right lobe of liver. No evident adenopathy. Critical Value/emergent results were called by telephone at the time of interpretation on 09/17/2016 at 3:47 pm to Dr. Sherwood Gambler , who verbally acknowledged these results. Electronically Signed   By: Lowella Grip III M.D.   On: 09/17/2016 15:47    Procedures Procedures (including critical care time)  CRITICAL CARE Performed by: Sherwood Gambler T   Total critical care time: 30 minutes  Critical care  time was exclusive of separately billable procedures and treating other patients.  Critical care was necessary to treat or prevent imminent or life-threatening deterioration.  Critical care was time spent personally by me on the following activities: development of treatment plan with patient and/or surrogate as well as nursing, discussions with consultants, evaluation of patient's response to treatment, examination of patient, obtaining history from patient or surrogate, ordering and performing treatments and interventions, ordering and review of laboratory studies, ordering and review of radiographic studies, pulse oximetry and re-evaluation of patient's condition.   Medications Ordered in ED Medications  fentaNYL  (SUBLIMAZE) injection 50 mcg (50 mcg Intravenous Given 09/17/16 1440)  azithromycin (ZITHROMAX) 500 mg in dextrose 5 % 250 mL IVPB (500 mg Intravenous New Bag/Given 09/17/16 1650)  heparin ADULT infusion 100 units/mL (25000 units/281mL sodium chloride 0.45%) (1,300 Units/hr Intravenous New Bag/Given 09/17/16 1649)  azithromycin (ZITHROMAX) 500 MG injection (not administered)  sodium chloride 0.9 % bolus 1,000 mL (1,000 mLs Intravenous New Bag/Given 09/17/16 1615)  HYDROmorphone (DILAUDID) injection 1 mg (1 mg Intravenous Given 09/17/16 1518)  iopamidol (ISOVUE-370) 76 % injection 100 mL (100 mLs Intravenous Contrast Given 09/17/16 1526)  cefTRIAXone (ROCEPHIN) 1 g in dextrose 5 % 50 mL IVPB (1 g Intravenous New Bag/Given 09/17/16 1610)  heparin bolus via infusion 5,000 Units (5,000 Units Intravenous Bolus from Bag 09/17/16 1648)     Initial Impression / Assessment and Plan / ED Course  I have reviewed the triage vital signs and the nursing notes.  Pertinent labs & imaging results that were available during my care of the patient were reviewed by me and considered in my medical decision making (see chart for details).  Clinical Course as of Sep 18 1651  Fri Sep 17, 2016  1511 My highest concern is for PE. Given recent trip, tachycardia and transient calf pain, will get CT. IV dilaudid for pain, IV fluids, monitor  [SG]  1552 PE and PNA seen on CT. PE is oddly on the right but present. Given this with PNA, tachycardia and pain, will need admission  [SG]  1619 Dr Landis Gandy to admit to tele obs at [facility]  [SG]    Clinical Course User Index [SG] Sherwood Gambler, MD    Patient has not had hypotension or signs of poor perfusion. Treat for both PE and Pneumonia. Transfer to [facility] for admission.  Final Clinical Impressions(s) / ED Diagnoses   Final diagnoses:  Other acute pulmonary embolism without acute cor pulmonale ([place])  Community acquired pneumonia of left lower lobe of lung ([place])    New  Prescriptions New Prescriptions   No medications on file     Sherwood Gambler, MD 09/17/16 1653

## 2016-09-17 NOTE — Progress Notes (Signed)
ANTICOAGULATION CONSULT NOTE - Initial Consult  Pharmacy Consult for Heparin Indication: pulmonary embolus  No Known Allergies  Patient Measurements: Height: 5\' 2"  (157.5 cm) Weight: 240 lb (108.9 kg) IBW/kg (Calculated) : 50.1 Heparin Dosing Weight: 76.5 kg  Vital Signs: Temp: 99.5 F (37.5 C) (12/22 1303) Temp Source: Oral (12/22 1303) BP: 142/69 (12/22 1515) Pulse Rate: 111 (12/22 1515)  Labs:  Recent Labs  09/17/16 1431 09/17/16 1437  HGB 12.8  --   HCT 38.7  --   PLT 223  --   CREATININE 0.65  --   TROPONINI  --  <0.03    Estimated Creatinine Clearance: 120.6 mL/min (by C-G formula based on SCr of 0.65 mg/dL).   Medical History: Past Medical History:  Diagnosis Date  . Asthma     Medications:   (Not in a hospital admission) Scheduled:   Infusions:  . azithromycin    . cefTRIAXone (ROCEPHIN)  IV    . sodium chloride      Assessment: 30yo female with history of asthma presents to [facility] with sharp stabbing left-sided chest pain. Pharmacy is consulted to dose heparin for PE.  Goal of Therapy:  Heparin level 0.3-0.7 units/ml Monitor platelets by anticoagulation protocol: Yes   Plan:  Give 5000 units bolus x 1 Start heparin infusion at 1300 units/hr Check anti-Xa level in 6 hours and daily while on heparin Continue to monitor H&H and platelets  Andrey Cota. Diona Foley, PharmD, [place] Clinical Pharmacist Pager [phone] 09/17/2016,4:00 PM

## 2016-09-17 NOTE — ED Notes (Signed)
Patient assessed- while in the room the patient did appear anxious- the patient is also having pain in her left chest and into her left arm. Reports that the pain is like a "spasm" pain which at times is more intense and caused her to cry out in pain. Patient reports that SOB and increased pain with inspiration. The patient traveled to [place] a few weeks ago.

## 2016-09-18 ENCOUNTER — Observation Stay (HOSPITAL_COMMUNITY): Payer: BLUE CROSS/BLUE SHIELD

## 2016-09-18 ENCOUNTER — Observation Stay (HOSPITAL_BASED_OUTPATIENT_CLINIC_OR_DEPARTMENT_OTHER): Payer: BLUE CROSS/BLUE SHIELD

## 2016-09-18 DIAGNOSIS — D1809 Hemangioma of other sites: Secondary | ICD-10-CM | POA: Diagnosis present

## 2016-09-18 DIAGNOSIS — J189 Pneumonia, unspecified organism: Secondary | ICD-10-CM | POA: Diagnosis present

## 2016-09-18 DIAGNOSIS — J181 Lobar pneumonia, unspecified organism: Secondary | ICD-10-CM | POA: Diagnosis not present

## 2016-09-18 DIAGNOSIS — I2699 Other pulmonary embolism without acute cor pulmonale: Secondary | ICD-10-CM

## 2016-09-18 DIAGNOSIS — Z6841 Body Mass Index (BMI) 40.0 and over, adult: Secondary | ICD-10-CM | POA: Diagnosis not present

## 2016-09-18 DIAGNOSIS — J45909 Unspecified asthma, uncomplicated: Secondary | ICD-10-CM | POA: Diagnosis present

## 2016-09-18 DIAGNOSIS — R0600 Dyspnea, unspecified: Secondary | ICD-10-CM | POA: Diagnosis not present

## 2016-09-18 DIAGNOSIS — D1803 Hemangioma of intra-abdominal structures: Secondary | ICD-10-CM | POA: Diagnosis present

## 2016-09-18 DIAGNOSIS — Z87891 Personal history of nicotine dependence: Secondary | ICD-10-CM | POA: Diagnosis not present

## 2016-09-18 DIAGNOSIS — M7989 Other specified soft tissue disorders: Secondary | ICD-10-CM | POA: Diagnosis present

## 2016-09-18 HISTORY — DX: Pneumonia, unspecified organism: J18.9

## 2016-09-18 LAB — CBC
HEMATOCRIT: 35.3 % — AB (ref 36.0–46.0)
HEMOGLOBIN: 11.7 g/dL — AB (ref 12.0–15.0)
MCH: 29.9 pg (ref 26.0–34.0)
MCHC: 33.1 g/dL (ref 30.0–36.0)
MCV: 90.3 fL (ref 78.0–100.0)
Platelets: 214 10*3/uL (ref 150–400)
RBC: 3.91 MIL/uL (ref 3.87–5.11)
RDW: 13.6 % (ref 11.5–15.5)
WBC: 9.3 10*3/uL (ref 4.0–10.5)

## 2016-09-18 LAB — BASIC METABOLIC PANEL
ANION GAP: 8 (ref 5–15)
BUN: <5 mg/dL — ABNORMAL LOW (ref 6–20)
CALCIUM: 9.2 mg/dL (ref 8.9–10.3)
CO2: 24 mmol/L (ref 22–32)
CREATININE: 0.57 mg/dL (ref 0.44–1.00)
Chloride: 101 mmol/L (ref 101–111)
GLUCOSE: 124 mg/dL — AB (ref 65–99)
Potassium: 3.6 mmol/L (ref 3.5–5.1)
Sodium: 133 mmol/L — ABNORMAL LOW (ref 135–145)

## 2016-09-18 LAB — TROPONIN I: Troponin I: <0.03 ng/mL (ref <0.03)

## 2016-09-18 LAB — ECHOCARDIOGRAM COMPLETE
Height: 62 in
WEIGHTICAEL: 3920 [oz_av]

## 2016-09-18 LAB — HEPARIN LEVEL (UNFRACTIONATED): Heparin Unfractionated: 0.22 IU/mL — ABNORMAL LOW (ref 0.30–0.70)

## 2016-09-18 MED ORDER — TRAMADOL HCL 50 MG PO TABS
50.0000 mg | ORAL_TABLET | Freq: Four times a day (QID) | ORAL | Status: DC | PRN
  Administered 2016-09-18: 50 mg via ORAL
  Filled 2016-09-18: qty 1

## 2016-09-18 MED ORDER — RIVAROXABAN 15 MG PO TABS
15.0000 mg | ORAL_TABLET | Freq: Two times a day (BID) | ORAL | Status: DC
  Administered 2016-09-18 – 2016-09-19 (×2): 15 mg via ORAL
  Filled 2016-09-18 (×2): qty 1

## 2016-09-18 MED ORDER — RIVAROXABAN 15 MG PO TABS
15.0000 mg | ORAL_TABLET | ORAL | Status: AC
  Administered 2016-09-18: 15 mg via ORAL
  Filled 2016-09-18: qty 1

## 2016-09-18 MED ORDER — RIVAROXABAN 20 MG PO TABS: 20.0000 mg | ORAL_TABLET | Freq: Every day | ORAL | Status: DC

## 2016-09-18 MED ORDER — HEPARIN BOLUS VIA INFUSION
2000.0000 [IU] | Freq: Once | INTRAVENOUS | Status: AC
  Administered 2016-09-18: 2000 [IU] via INTRAVENOUS
  Filled 2016-09-18: qty 2000

## 2016-09-18 MED ORDER — RIVAROXABAN (XARELTO) EDUCATION KIT FOR DVT/PE PATIENTS
PACK | Freq: Once | Status: AC
  Administered 2016-09-18: 12:00:00
  Filled 2016-09-18: qty 1

## 2016-09-18 NOTE — Progress Notes (Signed)
Xarelto co pay cards provided to patient when education completed  Heru Montz S. Alford Highland, PharmD, [place] Clinical Staff Pharmacist Pager [phone]

## 2016-09-18 NOTE — Progress Notes (Signed)
[facility]                                                                              Patient Demographics  Wanda Fuller, is a 30 y.o. female, DOB - 06-13-1986, CO:[id number]  Admit date - 09/17/2016   Admitting Physician Norval Morton, MD  Outpatient Primary MD for the patient is Windell Hummingbird, [place]  Outpatient specialists:   LOS - 0  days    Chief Complaint  Patient presents with  . Chest Pain       Brief summary  Wanda Fuller is a 30 y.o. female with medical history significant of asthma; who presents with complaints of a 2 day history of left-sided chest painWith shortness of breath, nausea, vomiting and low-grade fevers, minimal cough and left leg swelling. Patient returned from a recent trip to [place] on December 7. CT angiogram of the chest showed multiple right lower lobe PE and left lower infiltrates.   Assessment & Plan    Principal Problem:   Pulmonary embolism (HCC) Active Problems:   CAP (community acquired pneumonia)   Dyspnea   Calf swelling   Hemangioma of liver  Pulmonary embolism: Acute. Pulmonary embolus seen on CT angiogram of the chest after elevated d-dimer obtained. Risk factors include trip to [place]. - Currently on heparin drip per pharmacy - Follow 2-D echo, Doppler ultrasound of the lower extremities - Discussed in detail with the patient regarding Lovenox, Coumadin versus NOAC's. Patient wishes to be on NOAC's per her preference. Case management consult for insurance benefits for xarelto versus eliquis - will transition to [place] per pharmacy, [place] education per pharmacy.   Community-acquired pneumonia: Acute. Seen on imaging. - Continue empiric antibiotics of azithromycin and ceftriaxone - Follow-up cultures  - Placed on incentive spirometry, continue duo nebs, O2  Left calf swelling: Acute. Following recent trip to [place].  - Follow-up lower extremity Doppler  Hemangioma of the liver:  Incidental finding on CT angiogram  Code Status full CODE STATUS DVT Prophylaxis: Heparin Family Communication: Discussed in detail with the patient, all imaging results, lab results explained to the patient and family member in the room   Disposition Plan: Hopefully tomorrow  Time Spent in minutes 25 minutes  Procedures:  CTA of the chest  Consultants:   None  Antimicrobials:      Medications  Scheduled Meds: . azithromycin  500 mg Intravenous Q24H  . cefTRIAXone (ROCEPHIN)  IV  1 g Intravenous Q24H   Continuous Infusions: . heparin 1,550 Units/hr (09/18/16 0548)   PRN Meds:.acetaminophen **OR** acetaminophen, HYDROcodone-acetaminophen, HYDROmorphone (DILAUDID) injection, ipratropium-albuterol, ketorolac, ondansetron **OR** ondansetron (ZOFRAN) IV, traMADol   Antibiotics   Anti-infectives    Start     Dose/Rate Route Frequency Ordered Stop   09/18/16 1400  cefTRIAXone (ROCEPHIN) 1 g in dextrose 5 % 50 mL IVPB     1 g 100 mL/hr over 30 Minutes Intravenous Every 24 hours 09/17/16 1947 09/25/16 1359   09/18/16 1400  azithromycin (ZITHROMAX) 500 mg in dextrose 5 % 250 mL IVPB     500 mg 250 mL/hr over 60 Minutes Intravenous Every 24 hours 09/17/16 1947 09/25/16 1359  09/17/16 1617  azithromycin (ZITHROMAX) 500 MG injection    Comments:  Lurene Shadow   : cabinet override      09/17/16 1617 09/18/16 0429   09/17/16 1600  cefTRIAXone (ROCEPHIN) 1 g in dextrose 5 % 50 mL IVPB     1 g 100 mL/hr over 30 Minutes Intravenous  Once 09/17/16 1552 09/17/16 1640   09/17/16 1600  azithromycin (ZITHROMAX) 500 mg in dextrose 5 % 250 mL IVPB     500 mg 250 mL/hr over 60 Minutes Intravenous  Once 09/17/16 1552 09/17/16 1750        Subjective:   Wanda Fuller was seen and examined today. Still having pleuritic chest pain on coughing. No fevers or chills. Shortness of breath is improving. Patient denies dizziness, abdominal pain, N/V/D/C, new weakness, numbess, tingling.  No acute events overnight.    Objective:   Vitals:   09/17/16 1730 09/17/16 1848 09/17/16 2044 09/18/16 0400  BP: (!) 129/103 (!) 140/92 (!) 131/92 134/84  Pulse: 110 (!) 104 100 (!) 103  Resp: 21  18 18   Temp:   98.8 F (37.1 C) 98.6 F (37 C)  TempSrc:   Oral Oral  SpO2: 99% 95% 97% 96%  Weight:    111.1 kg (245 lb)  Height:        Intake/Output Summary (Last 24 hours) at 09/18/16 1132 Last data filed at 09/18/16 0911  Gross per 24 hour  Intake             1290 ml  Output              500 ml  Net              790 ml     Wt Readings from Last 3 Encounters:  09/18/16 111.1 kg (245 lb)  09/28/14 102.1 kg (225 lb)  08/16/12 99.3 kg (219 lb)     Exam  General: Alert and oriented x 3, NAD, Uncomfortable  HEENT:  PERRLA, EOMI, Anicteric Sclera, mucous membranes moist.   Neck: Supple, no JVD, no masses  Cardiovascular: S1 S2 auscultated, no rubs, murmurs or gallops. Regular rate and rhythm.  Respiratory: Clear to auscultation bilaterally, no wheezing, rales or rhonchi  Gastrointestinal: Soft, nontender, nondistended, + bowel sounds  Ext: no cyanosis clubbing or edema  Neuro: AAOx3, Cr N's II- XII. Strength 5/5 upper and lower extremities bilaterally  Skin: No rashes  Psych: Normal affect and demeanor, alert and oriented x3    Data Reviewed:  I have personally reviewed following labs and imaging studies  Micro Results No results found for this or any previous visit (from the past 240 hour(s)).  Radiology Reports Dg Chest 2 View  Result Date: 09/17/2016 CLINICAL DATA:  pain in her left chest and into her left arm. Reports that the pain is like a "spasm" pain which at times is more intense and caused her to cry out in pain. Patient reports that SOB and increased pain with inspiration. The patient traveled to [place] a few weeks ago. During xray- pt having difficulty taking a deep breath- stating that it hurts to breathe deeply EXAM: CHEST - 2 VIEW COMPARISON:   11/11/2009 FINDINGS: Low lung volumes with some subsegmental atelectasis versus early infiltrate at the left lung base. Right lung clear. Heart size upper limits normal for technique. No effusion. Visualized bones unremarkable. IMPRESSION: 1. Low lung volumes. Patchy infiltrate or atelectasis, left lung base. Electronically Signed   By: Eden Emms.D.  On: 09/17/2016 14:57   Ct Angio Chest Pe W/cm &/or Wo Cm  Result Date: 09/17/2016 CLINICAL DATA:  Shortness of breath and chest pain EXAM: CT ANGIOGRAPHY CHEST WITH CONTRAST TECHNIQUE: Multidetector CT imaging of the chest was performed using the standard protocol during bolus administration of intravenous contrast. Multiplanar CT image reconstructions and MIPs were obtained to evaluate the vascular anatomy. CONTRAST:  100 mL Isovue 370 nonionic COMPARISON:  Chest radiograph September 17, 2016 FINDINGS: Cardiovascular: There are pulmonary emboli in multiple branches of the right lower lobe pulmonary artery. No more central pulmonary emboli are evident. There is no appreciable right heart strain ; the right ventricle to left ventricular diameter ratio is well less than 0.9. There is no thoracic aortic aneurysm or dissection. Visualized great vessels appear unremarkable. Pericardium is not appreciably thickened. Mediastinum/Nodes: Visualized thyroid appears unremarkable. There is no appreciable thoracic adenopathy. Lungs/Pleura: There is a small left pleural effusion with patchy airspace consolidation in portions of the left lower lobe. There is slight atelectasis in the right middle lobe. Lungs elsewhere are clear. Upper Abdomen: Visualized upper abdominal structures appear unremarkable with the exception of an apparent hemangioma in the anterior segment right lobe of the liver medially measuring 2 2.7 x 2.0 x 1.4 cm. Musculoskeletal: There are no blastic or lytic bone lesions. Review of the MIP images confirms the above findings. IMPRESSION: Several right lower  lobe distribution pulmonary emboli. No right heart strain. Patchy infiltrate left lower lobe with small left pleural effusion. Mild right middle lobe atelectasis. Hemangioma in right lobe of liver. No evident adenopathy. Critical Value/emergent results were called by telephone at the time of interpretation on 09/17/2016 at 3:47 pm to Dr. Sherwood Gambler , who verbally acknowledged these results. Electronically Signed   By: Lowella Grip III M.D.   On: 09/17/2016 15:47    Lab Data:  CBC:  Recent Labs Lab 09/17/16 1431 09/18/16 0300  WBC 9.4 9.3  HGB 12.8 11.7*  HCT 38.7 35.3*  MCV 91.3 90.3  PLT 223 [id number]   Basic Metabolic Panel:  Recent Labs Lab 09/17/16 1431 09/18/16 0300  NA 136 133*  K 3.6 3.6  CL 103 101  CO2 25 24  GLUCOSE 93 124*  BUN 10 <5*  CREATININE 0.65 0.57  CALCIUM 9.9 9.2   GFR: Estimated Creatinine Clearance: 122 mL/min (by C-G formula based on SCr of 0.57 mg/dL). Liver Function Tests: No results for input(s): AST, ALT, ALKPHOS, BILITOT, PROT, ALBUMIN in the last 168 hours. No results for input(s): LIPASE, AMYLASE in the last 168 hours. No results for input(s): AMMONIA in the last 168 hours. Coagulation Profile: No results for input(s): INR, PROTIME in the last 168 hours. Cardiac Enzymes:  Recent Labs Lab 09/17/16 1437 09/17/16 2025 09/18/16 0300 09/18/16 0731  TROPONINI <0.03 <0.03 <0.03 <0.03   BNP (last 3 results) No results for input(s): PROBNP in the last 8760 hours. HbA1C: No results for input(s): HGBA1C in the last 72 hours. CBG: No results for input(s): GLUCAP in the last 168 hours. Lipid Profile: No results for input(s): CHOL, HDL, LDLCALC, TRIG, CHOLHDL, LDLDIRECT in the last 72 hours. Thyroid Function Tests: No results for input(s): TSH, T4TOTAL, FREET4, T3FREE, THYROIDAB in the last 72 hours. Anemia Panel: No results for input(s): VITAMINB12, FOLATE, FERRITIN, TIBC, IRON, RETICCTPCT in the last 72 hours. Urine analysis:      Component Value Date/Time   COLORURINE YELLOW 09/28/2014 0139   APPEARANCEUR CLOUDY (A) 09/28/2014 0139   LABSPEC 1.025 09/28/2014 0139  PHURINE 7.0 09/28/2014 0139   GLUCOSEU NEGATIVE 09/28/2014 0139   HGBUR NEGATIVE 09/28/2014 0139   BILIRUBINUR NEGATIVE 09/28/2014 0139   KETONESUR NEGATIVE 09/28/2014 0139   PROTEINUR NEGATIVE 09/28/2014 0139   UROBILINOGEN 1.0 09/28/2014 0139   NITRITE NEGATIVE 09/28/2014 0139   LEUKOCYTESUR NEGATIVE 09/28/2014 0139     Niki Payment M.D. [facility] 09/18/2016, 11:32 AM  Pager: [phone] Between 7am to 7pm - call Pager - [phone]  After 7pm go to [email or web address] - password [email or web address]  Call night coverage person covering after 7pm

## 2016-09-18 NOTE — Discharge Instructions (Signed)
Information on my medicine - XARELTO (rivaroxaban)  This medication education was reviewed with me or my healthcare representative as part of my discharge preparation.  The pharmacist that spoke with me during my hospital stay was:  Wayland Salinas, [place]? Xarelto was prescribed to treat blood clots that may have been found in the veins of your legs (deep vein thrombosis) or in your lungs (pulmonary embolism) and to reduce the risk of them occurring again.  What do you need to know about Xarelto? The starting dose is one 15 mg tablet taken TWICE daily with food for the FIRST 21 DAYS then on (enter date)  10/09/16  the dose is changed to one 20 mg tablet taken ONCE A DAY with your evening meal.  DO NOT stop taking Xarelto without talking to the health care provider who prescribed the medication.  Refill your prescription for 20 mg tablets before you run out.  After discharge, you should have regular check-up appointments with your healthcare provider that is prescribing your Xarelto.  In the future your dose may need to be changed if your kidney function changes by a significant amount.  What do you do if you miss a dose? If you are taking Xarelto TWICE DAILY and you miss a dose, take it as soon as you remember. You may take two 15 mg tablets (total 30 mg) at the same time then resume your regularly scheduled 15 mg twice daily the next day.  If you are taking Xarelto ONCE DAILY and you miss a dose, take it as soon as you remember on the same day then continue your regularly scheduled once daily regimen the next day. Do not take two doses of Xarelto at the same time.   Important Safety Information Xarelto is a blood thinner medicine that can cause bleeding. You should call your healthcare provider right away if you experience any of the following: ? Bleeding from an injury or your nose that does not stop. ? Unusual colored urine (red or  dark brown) or unusual colored stools (red or black). ? Unusual bruising for unknown reasons. ? A serious fall or if you hit your head (even if there is no bleeding).  Some medicines may interact with Xarelto and might increase your risk of bleeding while on Xarelto. To help avoid this, consult your healthcare provider or pharmacist prior to using any new prescription or non-prescription medications, including herbals, vitamins, non-steroidal anti-inflammatory drugs (NSAIDs) and supplements.  This website has more information on Xarelto: [email or web address]

## 2016-09-18 NOTE — Progress Notes (Signed)
VASCULAR LAB PRELIMINARY  PRELIMINARY  PRELIMINARY  PRELIMINARY  Bilateral lower extremity venous duplex completed.    Preliminary report:  There is no obvious evidence of DVT or SVT noted in the bilateral lower extremities.   Lilana Blasko, RVT 09/18/2016, 12:05 PM

## 2016-09-18 NOTE — Progress Notes (Signed)
  Echocardiogram 2D Echocardiogram has been performed.  Donata Clay 09/18/2016, 12:02 PM

## 2016-09-18 NOTE — Progress Notes (Signed)
ANTICOAGULATION CONSULT NOTE - Initial Consult  Pharmacy Consult for Xarelto Indication: pulmonary embolus  No Known Allergies  Patient Measurements: Height: 5\' 2"  (157.5 cm) Weight: 245 lb (111.1 kg) IBW/kg (Calculated) : 50.1   Vital Signs: Temp: 98.6 F (37 C) (12/23 0400) Temp Source: Oral (12/23 0400) BP: 134/84 (12/23 0400) Pulse Rate: 103 (12/23 0400)  Labs:  Recent Labs  09/17/16 1431  09/17/16 2025 09/17/16 2243 09/18/16 0300 09/18/16 0731  HGB 12.8  --   --   --  11.7*  --   HCT 38.7  --   --   --  35.3*  --   PLT 223  --   --   --  214  --   HEPARINUNFRC  --   --   --  0.37 0.22*  --   CREATININE 0.65  --   --   --  0.57  --   TROPONINI  --   < > <0.03  --  <0.03 <0.03  < > = values in this interval not displayed.  Estimated Creatinine Clearance: 122 mL/min (by C-G formula based on SCr of 0.57 mg/dL).   Medical History: Past Medical History:  Diagnosis Date  . Asthma     Assessment: 30yo female with history of asthma presents to [facility] with sharp stabbing left-sided chest pain, L leg swelling Returned from a recent trip to [place]. . Pharmacy is consulted to dose heparin for PE. -CT angiogram of the chest showed multiple right lower lobe PE and left lower infiltrates.  Anticoag: Heparin>>Xarelto for PE. Risk factors morbid obesity with recent travel. Baseline Hgb 12.8>11.7 overnight. CrCl>100 - Hemangioma of the liver: Incidental finding on CT angiogram  Goal of Therapy:  Therapeutic oral anticoagulation Monitor platelets by anticoagulation protocol: Yes   Plan:  Xarelto 15mg  BID x 21d then 20mg  daily   Laporche Martelle S. Alford Highland, PharmD, [place] Clinical Staff Pharmacist Pager [phone]  [place], [place] 09/18/2016,11:43 AM

## 2016-09-19 LAB — BASIC METABOLIC PANEL
ANION GAP: 9 (ref 5–15)
BUN: 6 mg/dL (ref 6–20)
CALCIUM: 9.3 mg/dL (ref 8.9–10.3)
CO2: 26 mmol/L (ref 22–32)
CREATININE: 0.65 mg/dL (ref 0.44–1.00)
Chloride: 99 mmol/L — ABNORMAL LOW (ref 101–111)
GFR calc Af Amer: >60 mL/min (ref >60)
GLUCOSE: 134 mg/dL — AB (ref 65–99)
Potassium: 3.5 mmol/L (ref 3.5–5.1)
Sodium: 134 mmol/L — ABNORMAL LOW (ref 135–145)

## 2016-09-19 LAB — CBC
HCT: 37.5 % (ref 36.0–46.0)
Hemoglobin: 12.1 g/dL (ref 12.0–15.0)
MCH: 30 pg (ref 26.0–34.0)
MCHC: 32.3 g/dL (ref 30.0–36.0)
MCV: 93.1 fL (ref 78.0–100.0)
PLATELETS: 207 10*3/uL (ref 150–400)
RBC: 4.03 MIL/uL (ref 3.87–5.11)
RDW: 14 % (ref 11.5–15.5)
WBC: 9.8 10*3/uL (ref 4.0–10.5)

## 2016-09-19 MED ORDER — RIVAROXABAN (XARELTO) VTE STARTER PACK (15 & 20 MG): ORAL_TABLET | ORAL | 0 refills | Status: DC

## 2016-09-19 MED ORDER — TRAMADOL HCL 50 MG PO TABS: 50.0000 mg | ORAL_TABLET | Freq: Four times a day (QID) | ORAL | 0 refills | Status: DC | PRN

## 2016-09-19 MED ORDER — LEVOFLOXACIN 750 MG PO TABS
750.0000 mg | ORAL_TABLET | Freq: Every day | ORAL | Status: DC
  Administered 2016-09-19: 750 mg via ORAL
  Filled 2016-09-19: qty 1

## 2016-09-19 MED ORDER — RIVAROXABAN 20 MG PO TABS: 20.0000 mg | ORAL_TABLET | Freq: Every day | ORAL | 3 refills | Status: DC

## 2016-09-19 MED ORDER — LEVOFLOXACIN 750 MG PO TABS: 750.0000 mg | ORAL_TABLET | Freq: Every day | ORAL | 0 refills | Status: DC

## 2016-09-19 NOTE — Discharge Summary (Signed)
Physician Discharge Summary   Patient ID: Wanda Fuller MRN: [id number] DOB/AGE: [birth year] 30 y.o.  Admit date: 09/17/2016 Discharge date: 09/19/2016  Primary Care Physician:  Windell Hummingbird, PA-C  Discharge Diagnoses:    . acute Pulmonary embolism ([place]) . CAP (community acquired pneumonia) . Dyspnea . Hemangioma of liver   Consults:  None   Recommendations for Outpatient Follow-up:  1. Patient started on xarelto for acute PE 2. Please repeat CBC/BMET at next visit   DIET: Heart healthy diet    Allergies:  No Known Allergies   DISCHARGE MEDICATIONS: Current Discharge Medication List    START taking these medications   Details  levofloxacin (LEVAQUIN) 750 MG tablet Take 1 tablet (750 mg total) by mouth daily. X 5 days Qty: 5 tablet, Refills: 0    rivaroxaban (XARELTO) 20 MG TABS tablet Take 1 tablet (20 mg total) by mouth daily with supper. Qty: 30 tablet, Refills: 3    Rivaroxaban 15 & 20 MG TBPK Take as directed on package: Start with one 15mg  tablet by mouth twice a day with food. On Day 22 (10/09/16), switch to one 20mg  tablet once a day with food. Qty: 51 each, Refills: 0    traMADol (ULTRAM) 50 MG tablet Take 1 tablet (50 mg total) by mouth every 6 (six) hours as needed for moderate pain or severe pain. Qty: 30 tablet, Refills: 0      CONTINUE these medications which have NOT CHANGED   Details  albuterol (PROVENTIL HFA;VENTOLIN HFA) 108 (90 Base) MCG/ACT inhaler Inhale 2 puffs into the lungs every 6 (six) hours as needed for wheezing or shortness of breath.    ibuprofen (ADVIL,MOTRIN) 200 MG tablet Take 400-600 mg by mouth every 6 (six) hours as needed for headache or cramping.         Brief H and P: For complete details please refer to admission H and P, but in briefLakeshia Fuller a 30 y.o.femalewith medical history significant of asthma;who presents with complaints of a 2 day history of left-sided chest painWith shortness of  breath, nausea, vomiting and low-grade fevers, minimal cough and left leg swelling. Patient returned from a recent trip to [place] on December 7. CT angiogram of the chest showed multiple right lower lobe PE and left lower infiltrates.  Hospital Course:  Pulmonary embolism: Acute. Pulmonary embolus seen on CT angiogram of the chest after elevated d-dimer obtained. Risk factors include trip to [place]. - Patient was initially placed on heparin drip.  - Discussed in detail with the patient regarding Lovenox, Coumadin versus NOAC's. Patient wishes to be on NOAC's per her preference. Patient was then transitioned to xarelto, 15 mg twice a day for first 21 days and then transition to 20 mg daily on day #22. Patient will continue xarelto until advised by her PCP to stop, ideally for 9 months to one year. - Doppler ultrasound of the lower extremities negative for DVT. - 2-D echo showed EF of 60-65% with grade 1 diastolic dysfunction.   Community-acquired pneumonia: Acute. Seen on imaging. - Blood cultures negative, patient was placed on IV antibiotics, incentive spirometry, duonebs, O2 supplementation - Transitioned to oral Levaquin for 5 days at discharge  Left calf swelling: Acute. Following recent trip to [place].  - Follow-up lower extremity Doppler negative for DVT  Hemangioma of the liver: Incidental finding on CT angiogram   Day of Discharge BP 138/80 (BP Location: Left Arm)   Pulse (!) 112   Temp 99.3 F (37.4 C) (Oral)   Resp  18   Ht 5\' 2"  (1.575 m)   Wt 111.1 kg (245 lb)   LMP 09/02/2016   SpO2 91%   BMI 44.81 kg/m   Physical Exam: General: Alert and awake oriented x3 not in any acute distress. HEENT: anicteric sclera, pupils reactive to light and accommodation CVS: S1-S2 clear no murmur rubs or gallops Chest: clear to auscultation bilaterally, no wheezing rales or rhonchi Abdomen: soft nontender, nondistended, normal bowel sounds Extremities: no cyanosis, clubbing or  edema noted bilaterally Neuro: Cranial nerves II-XII intact, no focal neurological deficits   The results of significant diagnostics from this hospitalization (including imaging, microbiology, ancillary and laboratory) are listed below for reference.    LAB RESULTS: Basic Metabolic Panel:  Recent Labs Lab 09/18/16 0300 09/19/16 0212  NA 133* 134*  K 3.6 3.5  CL 101 99*  CO2 24 26  GLUCOSE 124* 134*  BUN <5* 6  CREATININE 0.57 0.65  CALCIUM 9.2 9.3   Liver Function Tests: No results for input(s): AST, ALT, ALKPHOS, BILITOT, PROT, ALBUMIN in the last 168 hours. No results for input(s): LIPASE, AMYLASE in the last 168 hours. No results for input(s): AMMONIA in the last 168 hours. CBC:  Recent Labs Lab 09/18/16 0300 09/19/16 0212  WBC 9.3 9.8  HGB 11.7* 12.1  HCT 35.3* 37.5  MCV 90.3 93.1  PLT 214 207   Cardiac Enzymes:  Recent Labs Lab 09/18/16 0300 09/18/16 0731  TROPONINI <0.03 <0.03   BNP: Invalid input(s): POCBNP CBG: No results for input(s): GLUCAP in the last 168 hours.  Significant Diagnostic Studies:  Dg Chest 2 View  Result Date: 09/17/2016 CLINICAL DATA:  pain in her left chest and into her left arm. Reports that the pain is like a "spasm" pain which at times is more intense and caused her to cry out in pain. Patient reports that SOB and increased pain with inspiration. The patient traveled to [place] a few weeks ago. During xray- pt having difficulty taking a deep breath- stating that it hurts to breathe deeply EXAM: CHEST - 2 VIEW COMPARISON:  11/11/2009 FINDINGS: Low lung volumes with some subsegmental atelectasis versus early infiltrate at the left lung base. Right lung clear. Heart size upper limits normal for technique. No effusion. Visualized bones unremarkable. IMPRESSION: 1. Low lung volumes. Patchy infiltrate or atelectasis, left lung base. Electronically Signed   By: Lucrezia Europe M.D.   On: 09/17/2016 14:57   Ct Angio Chest Pe W/cm &/or Wo  Cm  Result Date: 09/17/2016 CLINICAL DATA:  Shortness of breath and chest pain EXAM: CT ANGIOGRAPHY CHEST WITH CONTRAST TECHNIQUE: Multidetector CT imaging of the chest was performed using the standard protocol during bolus administration of intravenous contrast. Multiplanar CT image reconstructions and MIPs were obtained to evaluate the vascular anatomy. CONTRAST:  100 mL Isovue 370 nonionic COMPARISON:  Chest radiograph September 17, 2016 FINDINGS: Cardiovascular: There are pulmonary emboli in multiple branches of the right lower lobe pulmonary artery. No more central pulmonary emboli are evident. There is no appreciable right heart strain ; the right ventricle to left ventricular diameter ratio is well less than 0.9. There is no thoracic aortic aneurysm or dissection. Visualized great vessels appear unremarkable. Pericardium is not appreciably thickened. Mediastinum/Nodes: Visualized thyroid appears unremarkable. There is no appreciable thoracic adenopathy. Lungs/Pleura: There is a small left pleural effusion with patchy airspace consolidation in portions of the left lower lobe. There is slight atelectasis in the right middle lobe. Lungs elsewhere are clear. Upper Abdomen: Visualized upper  abdominal structures appear unremarkable with the exception of an apparent hemangioma in the anterior segment right lobe of the liver medially measuring 2 2.7 x 2.0 x 1.4 cm. Musculoskeletal: There are no blastic or lytic bone lesions. Review of the MIP images confirms the above findings. IMPRESSION: Several right lower lobe distribution pulmonary emboli. No right heart strain. Patchy infiltrate left lower lobe with small left pleural effusion. Mild right middle lobe atelectasis. Hemangioma in right lobe of liver. No evident adenopathy. Critical Value/emergent results were called by telephone at the time of interpretation on 09/17/2016 at 3:47 pm to Dr. Sherwood Gambler , who verbally acknowledged these results. Electronically  Signed   By: Lowella Grip III M.D.   On: 09/17/2016 15:47    2D ECHO: Study Conclusions  - Left ventricle: The cavity size was normal. There was moderate   concentric hypertrophy. Systolic function was normal. The   estimated ejection fraction was in the range of 60% to 65%. Wall   motion was normal; there were no regional wall motion   abnormalities. Doppler parameters are consistent with abnormal   left ventricular relaxation (grade 1 diastolic dysfunction).   There was no evidence of elevated ventricular filling pressure by   Doppler parameters. - Aortic valve: Trileaflet; normal thickness leaflets. There was no   regurgitation. - Aortic root: The aortic root was normal in size. - Right ventricle: The cavity size was normal. Wall thickness was   normal. Systolic function was normal. - Right atrium: The atrium was normal in size. - Tricuspid valve: There was mild regurgitation. - Pulmonary arteries: Systolic pressure was within the normal   range. - Inferior vena cava: The vessel was normal in size. - Pericardium, extracardiac: There was no pericardial effusion.  Impressions:  - Right ventricle has normal size and function, no strain is seen.   RVSP is normal.  -------------------------------------------------------------------   Disposition and Follow-up: Discharge Instructions    Diet - low sodium heart healthy    Complete by:  As directed    Discharge instructions    Complete by:  As directed    Please continue Xarelto 15mg  Twice a day for 21 days. On 10/09/16, please switch to xarelto 20mg  daily with supper. Continue till advised by your doctor.   Increase activity slowly    Complete by:  As directed        DISPOSITION: home   [place], PA-C. Schedule an appointment as soon as possible for a visit in 2 week(s).   Specialty:  Physician Assistant Contact information: [address] [place]  [place] [address] [phone]            Time spent on Discharge: 19mins   Signed:   Kesha Hurrell M.D. [facility] 09/19/2016, 9:52 AM Pager: [phone]

## 2016-09-19 NOTE — Progress Notes (Signed)
Patient in a stable condition, discharge instructions completed with patient she verbalised understanding, paper prescriptions given to patient, patient belongings at bedside, iv removed , tele dc ccmd notified.

## 2016-09-22 LAB — CULTURE, BLOOD (ROUTINE X 2)
CULTURE: NO GROWTH
Culture: NO GROWTH

## 2016-09-29 DIAGNOSIS — N946 Dysmenorrhea, unspecified: Secondary | ICD-10-CM | POA: Diagnosis not present

## 2016-09-29 DIAGNOSIS — I2699 Other pulmonary embolism without acute cor pulmonale: Secondary | ICD-10-CM | POA: Diagnosis not present

## 2016-09-29 DIAGNOSIS — J189 Pneumonia, unspecified organism: Secondary | ICD-10-CM | POA: Diagnosis not present

## 2016-09-29 DIAGNOSIS — D539 Nutritional anemia, unspecified: Secondary | ICD-10-CM | POA: Diagnosis not present

## 2016-09-30 DIAGNOSIS — I2699 Other pulmonary embolism without acute cor pulmonale: Secondary | ICD-10-CM | POA: Diagnosis not present

## 2016-09-30 DIAGNOSIS — R0602 Shortness of breath: Secondary | ICD-10-CM | POA: Diagnosis not present

## 2016-09-30 DIAGNOSIS — J189 Pneumonia, unspecified organism: Secondary | ICD-10-CM | POA: Diagnosis not present

## 2016-10-04 DIAGNOSIS — R0602 Shortness of breath: Secondary | ICD-10-CM | POA: Diagnosis not present

## 2016-10-04 DIAGNOSIS — I2699 Other pulmonary embolism without acute cor pulmonale: Secondary | ICD-10-CM | POA: Diagnosis not present

## 2016-10-09 ENCOUNTER — Encounter (HOSPITAL_COMMUNITY): Payer: Self-pay | Admitting: Emergency Medicine

## 2016-10-09 ENCOUNTER — Emergency Department (HOSPITAL_COMMUNITY): Payer: 59

## 2016-10-09 ENCOUNTER — Emergency Department (HOSPITAL_COMMUNITY)
Admission: EM | Admit: 2016-10-09 | Discharge: 2016-10-09 | Disposition: A | Discharge status: Home / Self Care | Payer: 59 | Attending: Emergency Medicine | Admitting: Emergency Medicine

## 2016-10-09 DIAGNOSIS — R079 Chest pain, unspecified: Secondary | ICD-10-CM | POA: Diagnosis not present

## 2016-10-09 DIAGNOSIS — Z5321 Procedure and treatment not carried out due to patient leaving prior to being seen by health care provider: Secondary | ICD-10-CM | POA: Diagnosis not present

## 2016-10-09 HISTORY — DX: Other pulmonary embolism without acute cor pulmonale: I26.99

## 2016-10-09 LAB — BASIC METABOLIC PANEL
Anion gap: 8 (ref 5–15)
BUN: 10 mg/dL (ref 6–20)
CHLORIDE: 107 mmol/L (ref 101–111)
CO2: 23 mmol/L (ref 22–32)
Calcium: 9 mg/dL (ref 8.9–10.3)
Creatinine, Ser: 0.76 mg/dL (ref 0.44–1.00)
GFR calc Af Amer: >60 mL/min (ref >60)
GFR calc non Af Amer: >60 mL/min (ref >60)
Glucose, Bld: 103 mg/dL — ABNORMAL HIGH (ref 65–99)
POTASSIUM: 4.2 mmol/L (ref 3.5–5.1)
SODIUM: 138 mmol/L (ref 135–145)

## 2016-10-09 LAB — I-STAT TROPONIN, ED: Troponin i, poc: 0 ng/mL (ref 0.00–0.08)

## 2016-10-09 LAB — CBC
HCT: 32.5 % — ABNORMAL LOW (ref 36.0–46.0)
HEMOGLOBIN: 10.5 g/dL — AB (ref 12.0–15.0)
MCH: 29.7 pg (ref 26.0–34.0)
MCHC: 32.3 g/dL (ref 30.0–36.0)
MCV: 91.8 fL (ref 78.0–100.0)
Platelets: 274 10*3/uL (ref 150–400)
RBC: 3.54 MIL/uL — AB (ref 3.87–5.11)
RDW: 14 % (ref 11.5–15.5)
WBC: 7 10*3/uL (ref 4.0–10.5)

## 2016-10-09 NOTE — ED Triage Notes (Signed)
Patient with chest pain and shortness of breath.  Patient is being treated for PE at this time and is on Xarelto.  Patient noticed more shortness of breath and chest pain with movement and upper back pain, which she had when she was first diagnosed with PE.

## 2016-10-09 NOTE — ED Notes (Signed)
Patient walked out stated that she didn't want to be seen anymore.Wanda KitchenMarland Fuller

## 2016-10-22 DIAGNOSIS — I2699 Other pulmonary embolism without acute cor pulmonale: Secondary | ICD-10-CM | POA: Diagnosis not present

## 2016-10-22 DIAGNOSIS — E782 Mixed hyperlipidemia: Secondary | ICD-10-CM | POA: Diagnosis not present

## 2016-10-22 DIAGNOSIS — R760 Raised antibody titer: Secondary | ICD-10-CM | POA: Diagnosis not present

## 2016-11-15 DIAGNOSIS — R760 Raised antibody titer: Secondary | ICD-10-CM | POA: Diagnosis not present

## 2016-11-29 DIAGNOSIS — Z0000 Encounter for general adult medical examination without abnormal findings: Secondary | ICD-10-CM | POA: Diagnosis not present

## 2016-11-29 DIAGNOSIS — Z01419 Encounter for gynecological examination (general) (routine) without abnormal findings: Secondary | ICD-10-CM | POA: Diagnosis not present

## 2017-01-04 DIAGNOSIS — D649 Anemia, unspecified: Secondary | ICD-10-CM | POA: Diagnosis not present

## 2017-01-04 DIAGNOSIS — R760 Raised antibody titer: Secondary | ICD-10-CM | POA: Diagnosis not present

## 2017-04-06 DIAGNOSIS — R760 Raised antibody titer: Secondary | ICD-10-CM | POA: Diagnosis not present

## 2017-04-06 DIAGNOSIS — Z7901 Long term (current) use of anticoagulants: Secondary | ICD-10-CM | POA: Diagnosis not present

## 2017-04-13 DIAGNOSIS — Z01419 Encounter for gynecological examination (general) (routine) without abnormal findings: Secondary | ICD-10-CM | POA: Diagnosis not present

## 2017-04-14 DIAGNOSIS — Z124 Encounter for screening for malignant neoplasm of cervix: Secondary | ICD-10-CM | POA: Diagnosis not present

## 2017-05-16 DIAGNOSIS — R319 Hematuria, unspecified: Secondary | ICD-10-CM | POA: Diagnosis not present

## 2017-05-16 DIAGNOSIS — M25552 Pain in left hip: Secondary | ICD-10-CM | POA: Diagnosis not present

## 2017-05-16 DIAGNOSIS — K6289 Other specified diseases of anus and rectum: Secondary | ICD-10-CM | POA: Diagnosis not present

## 2017-08-25 IMAGING — CR DG CHEST 2V
2 series · 2 of 2 positions shown · non-contrast
Comparison: 11/11/2009

CLINICAL DATA: pain in her left chest and into her left arm.
Reports that the pain is like a "spasm" pain which at times is more
intense and caused her to cry out in pain. Patient reports that SOB
and increased pain with inspiration. The patient traveled to [REDACTED]
a few weeks ago. During xray- pt having difficulty taking a deep
breath- stating that it hurts to breathe deeply

EXAM:
CHEST - 2 VIEW

[w chest pa]
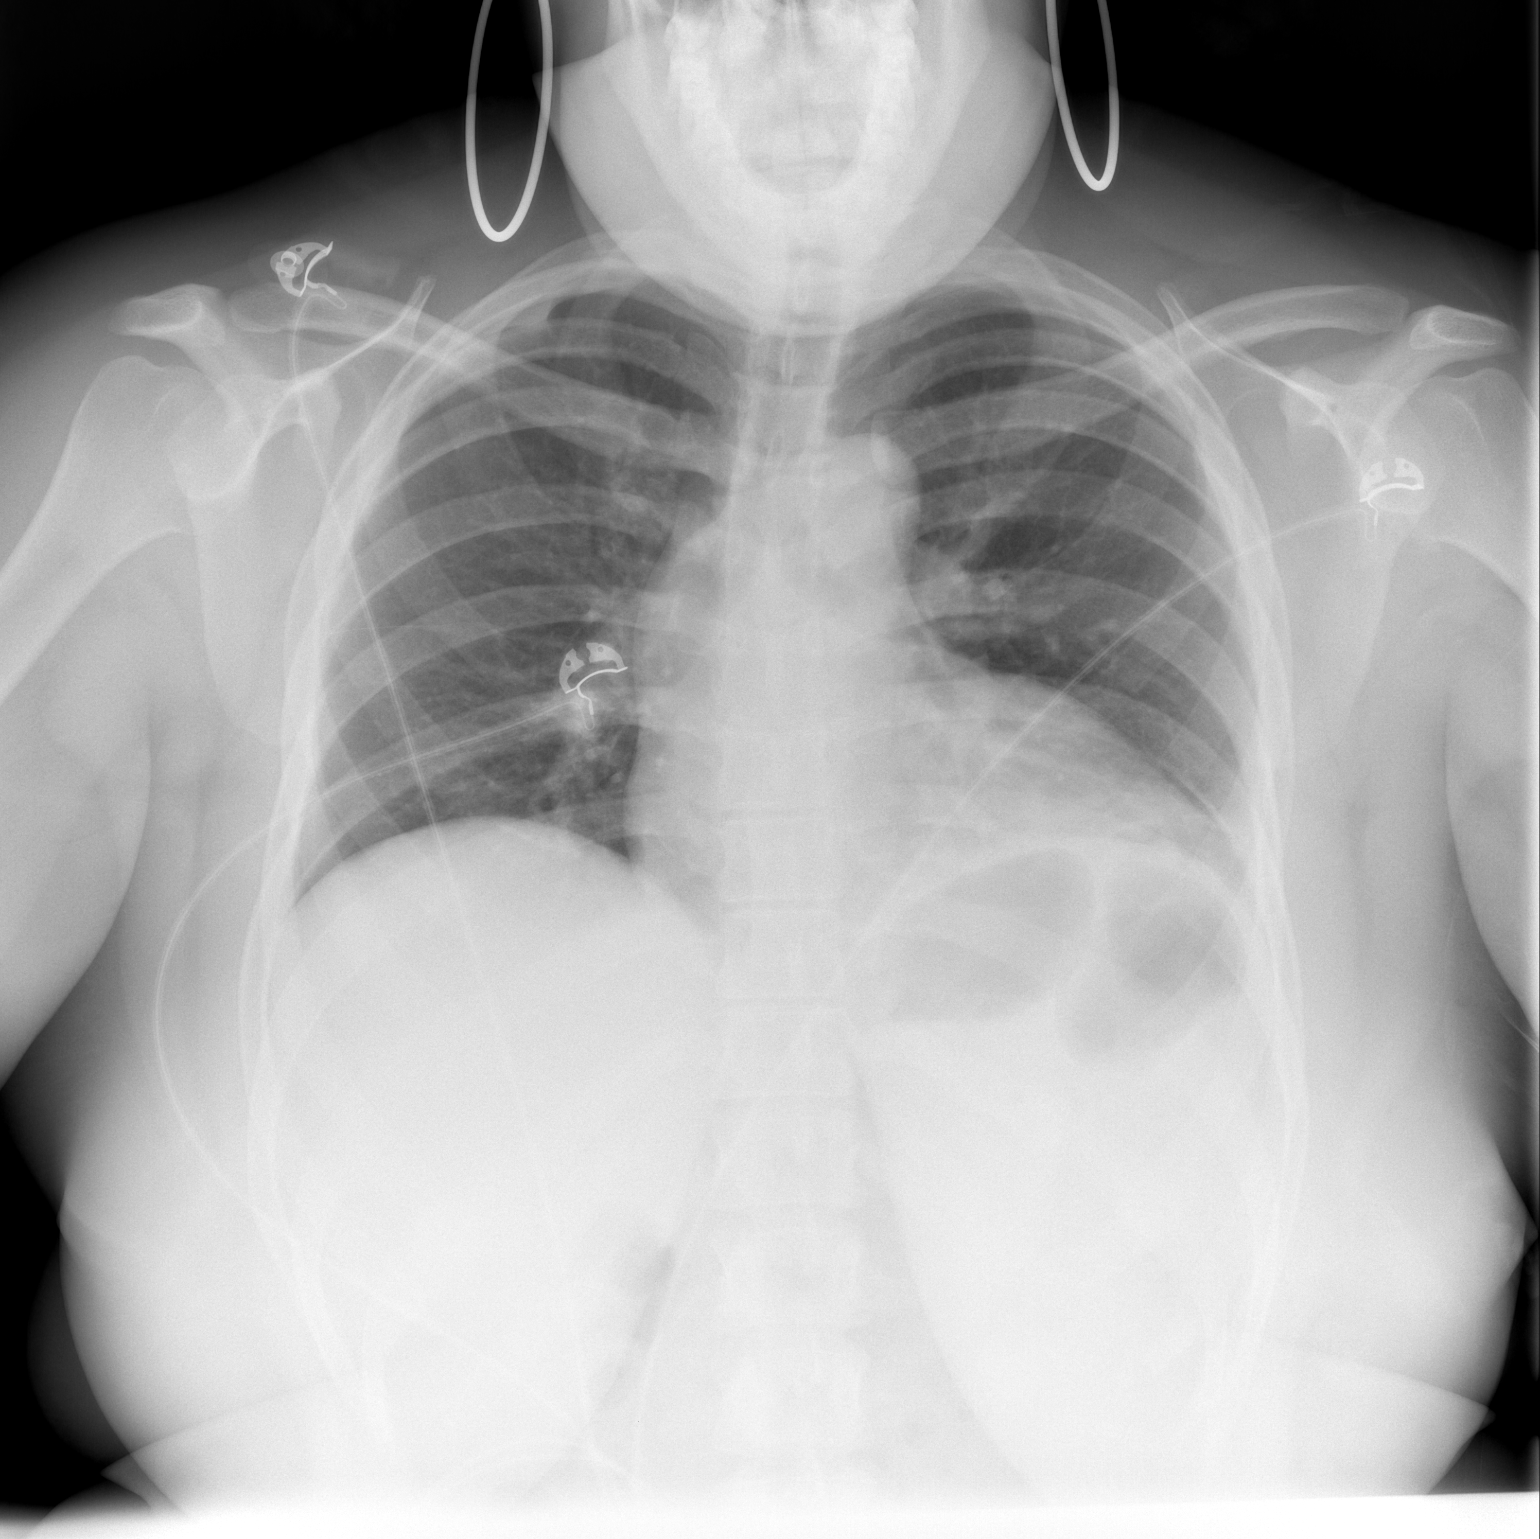

[w chest lat *]
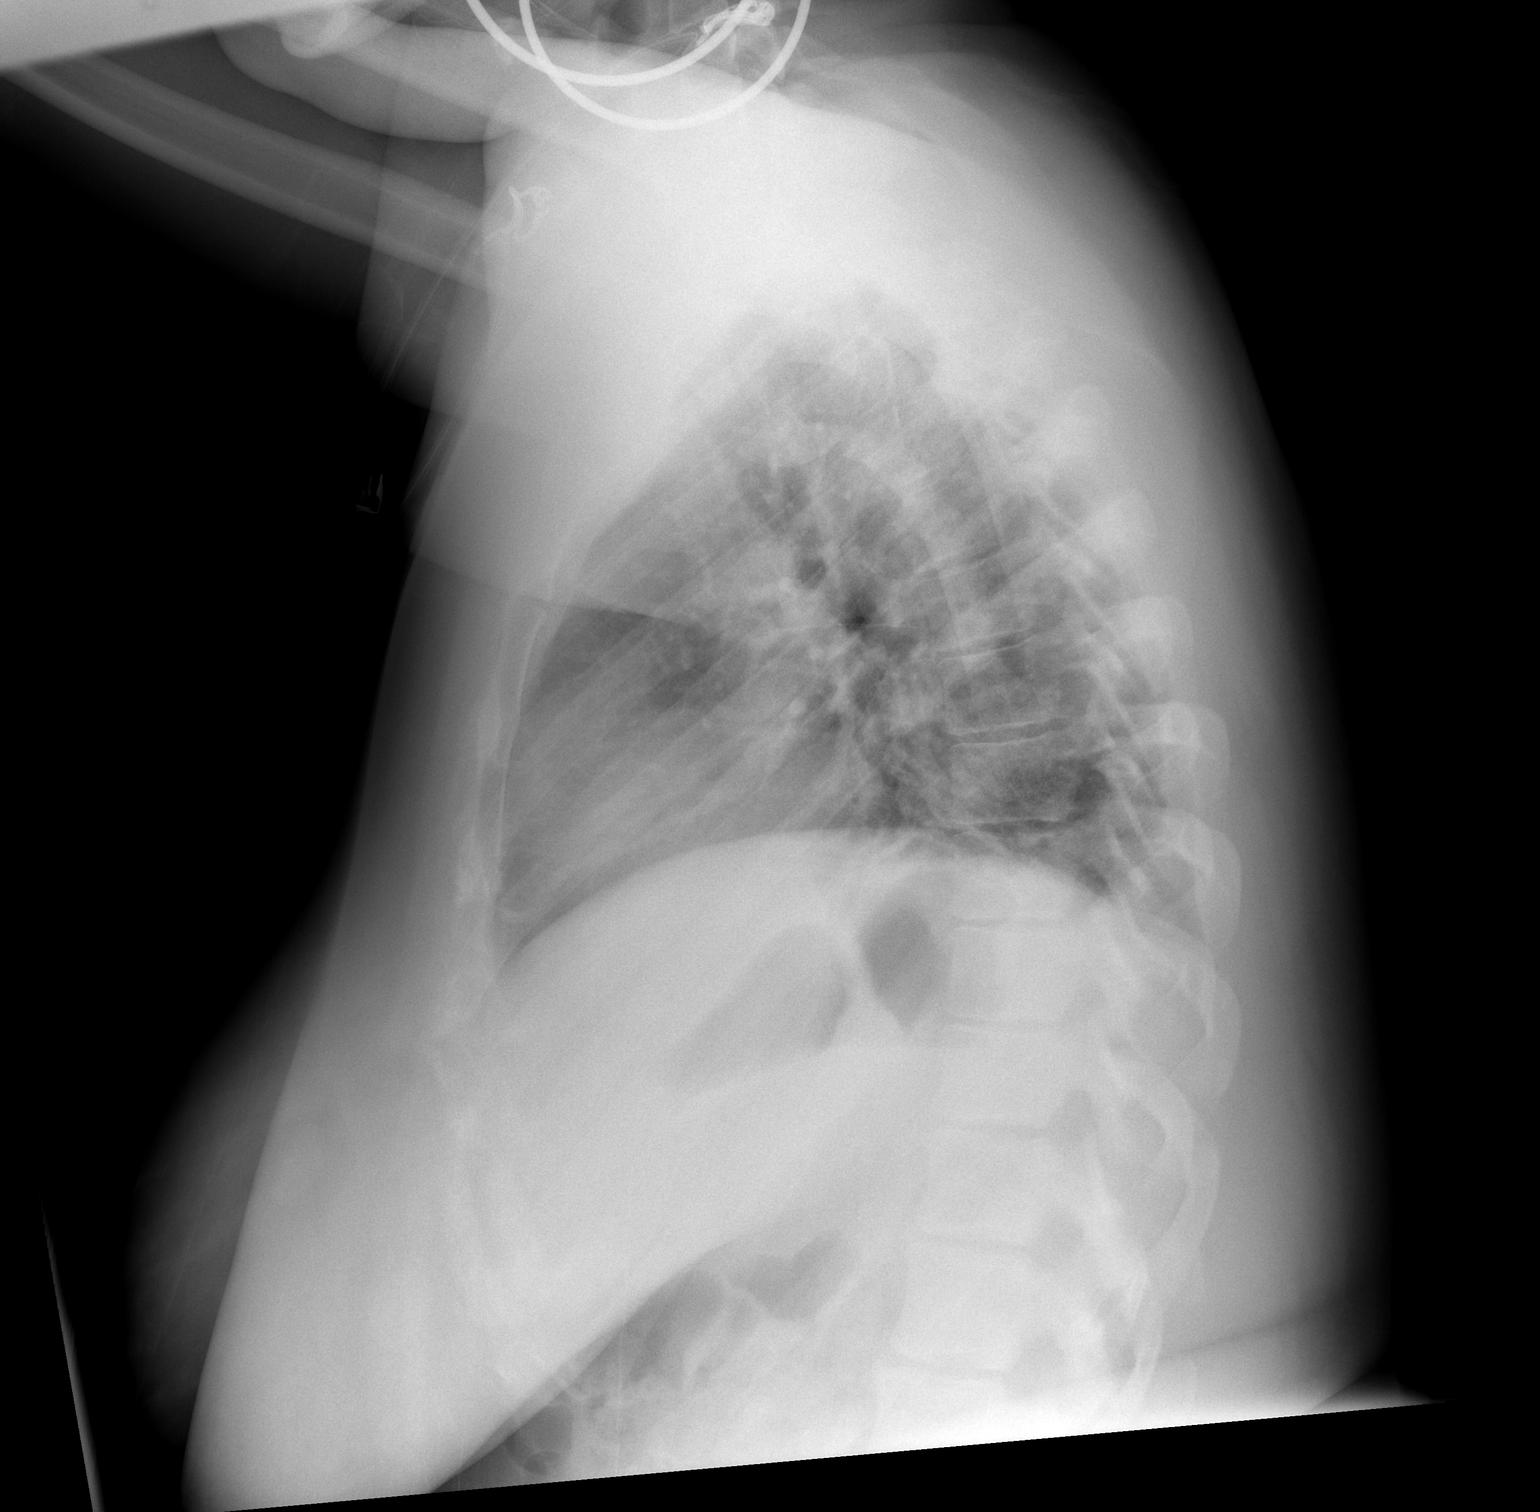

[2 of 2 positions shown; findings below may reference images not displayed]

FINDINGS: Low lung volumes with some subsegmental atelectasis versus early
infiltrate at the left lung base. Right lung clear.

Heart size upper limits normal for technique.

No effusion.

Visualized bones unremarkable.
IMPRESSION: 1. Low lung volumes. Patchy infiltrate or atelectasis, left lung
base.

## 2018-01-12 DIAGNOSIS — R5383 Other fatigue: Secondary | ICD-10-CM | POA: Diagnosis not present

## 2018-01-12 DIAGNOSIS — R42 Dizziness and giddiness: Secondary | ICD-10-CM | POA: Diagnosis not present

## 2018-01-12 DIAGNOSIS — H938X2 Other specified disorders of left ear: Secondary | ICD-10-CM | POA: Diagnosis not present

## 2018-05-10 DIAGNOSIS — Z01419 Encounter for gynecological examination (general) (routine) without abnormal findings: Secondary | ICD-10-CM | POA: Diagnosis not present

## 2018-05-10 DIAGNOSIS — L293 Anogenital pruritus, unspecified: Secondary | ICD-10-CM | POA: Diagnosis not present

## 2018-09-05 DIAGNOSIS — L293 Anogenital pruritus, unspecified: Secondary | ICD-10-CM | POA: Diagnosis not present

## 2018-09-05 DIAGNOSIS — Z09 Encounter for follow-up examination after completed treatment for conditions other than malignant neoplasm: Secondary | ICD-10-CM | POA: Diagnosis not present

## 2019-03-29 DIAGNOSIS — R52 Pain, unspecified: Secondary | ICD-10-CM | POA: Diagnosis not present

## 2019-03-29 DIAGNOSIS — N898 Other specified noninflammatory disorders of vagina: Secondary | ICD-10-CM | POA: Diagnosis not present

## 2019-05-30 ENCOUNTER — Other Ambulatory Visit: Payer: Self-pay | Admitting: Obstetrics and Gynecology

## 2019-05-30 DIAGNOSIS — N898 Other specified noninflammatory disorders of vagina: Secondary | ICD-10-CM | POA: Diagnosis not present

## 2019-05-30 DIAGNOSIS — N900 Mild vulvar dysplasia: Secondary | ICD-10-CM | POA: Diagnosis not present

## 2019-06-21 DIAGNOSIS — N898 Other specified noninflammatory disorders of vagina: Secondary | ICD-10-CM | POA: Diagnosis not present

## 2019-06-21 DIAGNOSIS — Z09 Encounter for follow-up examination after completed treatment for conditions other than malignant neoplasm: Secondary | ICD-10-CM | POA: Diagnosis not present

## 2019-06-21 DIAGNOSIS — N903 Dysplasia of vulva, unspecified: Secondary | ICD-10-CM | POA: Diagnosis not present

## 2019-08-21 DIAGNOSIS — N890 Mild vaginal dysplasia: Secondary | ICD-10-CM | POA: Diagnosis not present

## 2019-09-20 DIAGNOSIS — N921 Excessive and frequent menstruation with irregular cycle: Secondary | ICD-10-CM | POA: Diagnosis not present

## 2019-09-20 DIAGNOSIS — Z113 Encounter for screening for infections with a predominantly sexual mode of transmission: Secondary | ICD-10-CM | POA: Diagnosis not present

## 2019-09-20 DIAGNOSIS — Z01419 Encounter for gynecological examination (general) (routine) without abnormal findings: Secondary | ICD-10-CM | POA: Diagnosis not present

## 2019-09-27 ENCOUNTER — Ambulatory Visit: Payer: BC Managed Care – PPO | Attending: Internal Medicine

## 2019-09-27 DIAGNOSIS — Z20828 Contact with and (suspected) exposure to other viral communicable diseases: Secondary | ICD-10-CM | POA: Diagnosis not present

## 2019-09-27 DIAGNOSIS — Z20822 Contact with and (suspected) exposure to covid-19: Secondary | ICD-10-CM

## 2019-09-29 LAB — NOVEL CORONAVIRUS, NAA: SARS-CoV-2, NAA: NOT DETECTED

## 2019-11-08 DIAGNOSIS — N921 Excessive and frequent menstruation with irregular cycle: Secondary | ICD-10-CM | POA: Diagnosis not present

## 2019-11-08 DIAGNOSIS — N926 Irregular menstruation, unspecified: Secondary | ICD-10-CM | POA: Diagnosis not present

## 2020-03-13 DIAGNOSIS — H02846 Edema of left eye, unspecified eyelid: Secondary | ICD-10-CM | POA: Diagnosis not present

## 2020-10-22 DIAGNOSIS — N898 Other specified noninflammatory disorders of vagina: Secondary | ICD-10-CM | POA: Diagnosis not present

## 2020-10-22 DIAGNOSIS — Z124 Encounter for screening for malignant neoplasm of cervix: Secondary | ICD-10-CM | POA: Diagnosis not present

## 2020-10-22 DIAGNOSIS — Z01419 Encounter for gynecological examination (general) (routine) without abnormal findings: Secondary | ICD-10-CM | POA: Diagnosis not present

## 2020-10-22 DIAGNOSIS — Z113 Encounter for screening for infections with a predominantly sexual mode of transmission: Secondary | ICD-10-CM | POA: Diagnosis not present

## 2022-03-05 DIAGNOSIS — Z331 Pregnant state, incidental: Secondary | ICD-10-CM | POA: Diagnosis not present

## 2022-03-05 DIAGNOSIS — B3731 Acute candidiasis of vulva and vagina: Secondary | ICD-10-CM | POA: Diagnosis not present

## 2022-03-16 DIAGNOSIS — Z3A01 Less than 8 weeks gestation of pregnancy: Secondary | ICD-10-CM | POA: Diagnosis not present

## 2022-03-16 DIAGNOSIS — N925 Other specified irregular menstruation: Secondary | ICD-10-CM | POA: Diagnosis not present

## 2022-03-18 DIAGNOSIS — Z113 Encounter for screening for infections with a predominantly sexual mode of transmission: Secondary | ICD-10-CM | POA: Diagnosis not present

## 2022-03-18 DIAGNOSIS — R112 Nausea with vomiting, unspecified: Secondary | ICD-10-CM | POA: Diagnosis not present

## 2022-03-18 DIAGNOSIS — Z86711 Personal history of pulmonary embolism: Secondary | ICD-10-CM | POA: Diagnosis not present

## 2022-03-18 DIAGNOSIS — N912 Amenorrhea, unspecified: Secondary | ICD-10-CM | POA: Diagnosis not present

## 2022-03-22 LAB — OB RESULTS CONSOLE GC/CHLAMYDIA
Chlamydia: NEGATIVE
Neisseria Gonorrhea: NEGATIVE

## 2022-03-25 ENCOUNTER — Telehealth: Payer: Self-pay

## 2022-03-25 NOTE — Telephone Encounter (Signed)
We received a referral from Dr. Charlesetta Garibaldi to schedule for d MD Consult to discuss lovenox therapy.   Originally left voicemail for patient to call back and schedule. Patient called back and got the appointment scheduled.

## 2022-03-26 ENCOUNTER — Encounter: Payer: Self-pay | Admitting: *Deleted

## 2022-03-30 ENCOUNTER — Other Ambulatory Visit: Payer: Self-pay

## 2022-03-30 ENCOUNTER — Inpatient Hospital Stay (HOSPITAL_COMMUNITY)
Admission: AD | Admit: 2022-03-30 | Discharge: 2022-03-30 | Disposition: A | Discharge status: Home / Self Care | Payer: BC Managed Care – PPO | Attending: Obstetrics & Gynecology | Admitting: Obstetrics & Gynecology

## 2022-03-30 DIAGNOSIS — K920 Hematemesis: Secondary | ICD-10-CM

## 2022-03-30 DIAGNOSIS — Z3A09 9 weeks gestation of pregnancy: Secondary | ICD-10-CM | POA: Diagnosis not present

## 2022-03-30 DIAGNOSIS — O26891 Other specified pregnancy related conditions, first trimester: Secondary | ICD-10-CM | POA: Insufficient documentation

## 2022-03-30 DIAGNOSIS — O99611 Diseases of the digestive system complicating pregnancy, first trimester: Secondary | ICD-10-CM | POA: Diagnosis not present

## 2022-03-30 DIAGNOSIS — K117 Disturbances of salivary secretion: Secondary | ICD-10-CM

## 2022-03-30 LAB — URINALYSIS, ROUTINE W REFLEX MICROSCOPIC
Bacteria, UA: NONE SEEN
Bilirubin Urine: NEGATIVE
Glucose, UA: NEGATIVE mg/dL
Hgb urine dipstick: NEGATIVE
Ketones, ur: NEGATIVE mg/dL
Leukocytes,Ua: NEGATIVE
Nitrite: NEGATIVE
Protein, ur: 30 mg/dL — AB
Specific Gravity, Urine: 1.024 (ref 1.005–1.030)
pH: 7 (ref 5.0–8.0)

## 2022-03-30 MED ORDER — GLYCOPYRROLATE 1 MG PO TABS: 1.0000 mg | ORAL_TABLET | Freq: Three times a day (TID) | ORAL | 1 refills | Status: DC | PRN

## 2022-03-30 MED ORDER — PANTOPRAZOLE SODIUM 20 MG PO TBEC: 20.0000 mg | DELAYED_RELEASE_TABLET | Freq: Every day | ORAL | 0 refills | Status: DC

## 2022-03-30 NOTE — MAU Provider Note (Signed)
Chief Complaint: Abdominal Pain   Event Date/Time   First Provider Initiated Contact with Patient 03/30/22 1227        SUBJECTIVE HPI: Wanda Fuller is a 36 y.o. G1P0 at 5w3dby LMP who presents to maternity admissions reporting dry heaves at home with small amount of vomiting which had blood in it.  Did not notice any bleeding from gums.  Has been on Xarelto (hx PE) in past but is not on it currently.   She denies vaginal bleeding, vaginal itching/burning, urinary symptoms, h/a, dizziness, or fever/chills.  Has Diclegis for nausea which works, but has not taken it today   Also c/o excessive saliva production  Emesis  This is a recurrent problem. The current episode started today. The problem occurs less than 2 times per day. The emesis has an appearance of bright red blood (small amount of blood). There has been no fever. Pertinent negatives include no chills, diarrhea, dizziness, fever or myalgias. She has tried nothing for the symptoms.   RN Note: Wanda Fuller a 36y.o. at 974w3dere in MAU reporting: today had an episode of dry heaving and noticed some blood but states no emesis. States it was about 0.75 of a tablespoon. Denies vaginal bleeding. States she stopped taking the diclegis. No nausea currently. Feeling soreness from the dry heaving.   Past Medical History:  Diagnosis Date   Asthma    [place]   No past surgical history on file. Social History   Socioeconomic History   Marital status: Single    Spouse name: Not on file   Number of children: Not on file   Years of education: Not on file   Highest education level: Not on file  Occupational History   Not on file  Tobacco Use   Smoking status: Former   Smokeless tobacco: Never  Substance and Sexual Activity   Alcohol use: Yes   Drug use: No   Sexual activity: Yes    Birth control/protection: None  Other Topics Concern   Not on file  Social History Narrative   Not on file    Social Determinants of Health   Financial Resource Strain: Not on file  Food Insecurity: Not on file  Transportation Needs: Not on file  Physical Activity: Not on file  Stress: Not on file  Social Connections: Not on file  Intimate Partner Violence: Not on file   No current facility-administered medications on file prior to encounter.   Current Outpatient Medications on File Prior to Encounter  Medication Sig Dispense Refill   albuterol (PROVENTIL HFA;VENTOLIN HFA) 108 (90 Base) MCG/ACT inhaler Inhale 2 puffs into the lungs every 6 (six) hours as needed for wheezing or shortness of breath.     ibuprofen (ADVIL,MOTRIN) 200 MG tablet Take 400-600 mg by mouth every 6 (six) hours as needed for headache or cramping.     levofloxacin (LEVAQUIN) 750 MG tablet Take 1 tablet (750 mg total) by mouth daily. X 5 days 5 tablet 0   rivaroxaban (XARELTO) 20 MG TABS tablet Take 1 tablet (20 mg total) by mouth daily with supper. 30 tablet 3   Rivaroxaban 15 & 20 MG TBPK Take as directed on package: Start with one '15mg'$  tablet by mouth twice a day with food. On Day 22 (10/09/16), switch to one '20mg'$  tablet once a day with food. 51 each 0   traMADol (ULTRAM) 50 MG tablet Take 1 tablet (50 mg total) by mouth every 6 (six) hours  as needed for moderate pain or severe pain. 30 tablet 0   No Known Allergies  I have reviewed patient's Past Medical Hx, Surgical Hx, Family Hx, Social Hx, medications and allergies.   ROS:  Review of Systems  Constitutional:  Negative for chills and fever.  Gastrointestinal:  Positive for vomiting. Negative for diarrhea.  Musculoskeletal:  Negative for myalgias.  Neurological:  Negative for dizziness.   Review of Systems  Other systems negative   Physical Exam  Physical Exam Patient Vitals for the past 24 hrs:  BP Temp Temp src Pulse Resp SpO2 Height Weight  03/30/22 1030 129/87 98.3 F (36.8 C) Oral (!) 101 14 98 % -- --  03/30/22 1024 -- -- -- -- -- -- '5\' 2"'$  (1.575  m) 119.8 kg   Constitutional: Well-developed, well-nourished female in no acute distress.  HEENT:  Mouth examined, no bleeding gums Cardiovascular: normal rate Respiratory: normal effort GI: Abd soft, non-tender.  MS: Extremities nontender, no edema, normal ROM Neurologic: Alert and oriented x 4.  GU: Neg CVAT.   LAB RESULTS Results for orders placed or performed during the hospital encounter of 03/30/22 (from the past 24 hour(s))  Urinalysis, Routine w reflex microscopic Urine, Clean Catch     Status: Abnormal   Collection Time: 03/30/22 10:48 AM  Result Value Ref Range   Color, Urine AMBER (A) YELLOW   APPearance HAZY (A) CLEAR   Specific Gravity, Urine 1.024 1.005 - 1.030   pH 7.0 5.0 - 8.0   Glucose, UA NEGATIVE NEGATIVE mg/dL   Hgb urine dipstick NEGATIVE NEGATIVE   Bilirubin Urine NEGATIVE NEGATIVE   Ketones, ur NEGATIVE NEGATIVE mg/dL   Protein, ur 30 (A) NEGATIVE mg/dL   Nitrite NEGATIVE NEGATIVE   Leukocytes,Ua NEGATIVE NEGATIVE   RBC / HPF 0-5 0 - 5 RBC/hpf   WBC, UA 0-5 0 - 5 WBC/hpf   Bacteria, UA NONE SEEN NONE SEEN   Squamous Epithelial / LPF 11-20 0 - 5   Mucus PRESENT      IMAGING No results found.  MAU Management/MDM: I have reviewed the triage vital signs and the nursing notes.   Pertinent labs & imaging results that were available during my care of the patient were reviewed by me and considered in my medical decision making (see chart for details).      I have reviewed her medical records including past results, notes and treatments.   Discussed hematemesis is likely due to vomiting.  Likely due to esophageal irritation since it is not protected from stomach acid.  Recommend take diclegis every day even if not vomiting at the time. Will start her on some Protonix for acid reduction and Robinul for ptyalism.   ASSESSMENT Single IUP at 66w3dHematemesis associated with pregnancy nausea and vomiting Ptyalism  PLAN Discharge home Rx Protonix for acid  reduction Rx Robinul for ptyalism Follow up with MFM and [facility] as scheduled Pt stable at time of discharge. Encouraged to return here if she develops worsening of symptoms, increase in pain, fever, or other concerning symptoms.    MHansel FeinsteinCNM, MSN Certified Nurse-Midwife 03/30/2022  12:27 PM

## 2022-03-30 NOTE — MAU Note (Signed)
Wanda Fuller is a 36 y.o. at 81w3dhere in MAU reporting: today had an episode of dry heaving and noticed some blood but states no emesis. States it was about 0.75 of a tablespoon. Denies vaginal bleeding. States she stopped taking the diclegis. No nausea currently. Feeling soreness from the dry heaving.   Onset of complaint: today  Pain score: 3/10  Vitals:   03/30/22 1030  BP: 129/87  Pulse: (!) 101  Resp: 14  Temp: 98.3 F (36.8 C)  SpO2: 98%     Lab orders placed from triage: UA

## 2022-03-31 ENCOUNTER — Telehealth: Payer: Self-pay | Admitting: *Deleted

## 2022-03-31 ENCOUNTER — Ambulatory Visit: Payer: BC Managed Care – PPO | Attending: Obstetrics and Gynecology | Admitting: Maternal & Fetal Medicine

## 2022-03-31 ENCOUNTER — Ambulatory Visit: Payer: BC Managed Care – PPO | Admitting: *Deleted

## 2022-03-31 ENCOUNTER — Encounter: Payer: Self-pay | Admitting: *Deleted

## 2022-03-31 VITALS — BP 128/78 | HR 87

## 2022-03-31 DIAGNOSIS — D6862 Lupus anticoagulant syndrome: Secondary | ICD-10-CM | POA: Diagnosis not present

## 2022-03-31 DIAGNOSIS — O09891 Supervision of other high risk pregnancies, first trimester: Secondary | ICD-10-CM | POA: Insufficient documentation

## 2022-03-31 DIAGNOSIS — Z3A09 9 weeks gestation of pregnancy: Secondary | ICD-10-CM | POA: Diagnosis not present

## 2022-03-31 DIAGNOSIS — O99211 Obesity complicating pregnancy, first trimester: Secondary | ICD-10-CM | POA: Diagnosis not present

## 2022-03-31 DIAGNOSIS — I2699 Other pulmonary embolism without acute cor pulmonale: Secondary | ICD-10-CM

## 2022-03-31 DIAGNOSIS — O88211 Thromboembolism in pregnancy, first trimester: Secondary | ICD-10-CM | POA: Diagnosis present

## 2022-03-31 DIAGNOSIS — Z3A01 Less than 8 weeks gestation of pregnancy: Secondary | ICD-10-CM | POA: Diagnosis not present

## 2022-03-31 DIAGNOSIS — O99411 Diseases of the circulatory system complicating pregnancy, first trimester: Secondary | ICD-10-CM

## 2022-03-31 DIAGNOSIS — Z86711 Personal history of pulmonary embolism: Secondary | ICD-10-CM

## 2022-03-31 DIAGNOSIS — R760 Raised antibody titer: Secondary | ICD-10-CM | POA: Insufficient documentation

## 2022-03-31 NOTE — Telephone Encounter (Signed)
Pt called regarding having her Rx sent to an alternate pharmacy.

## 2022-03-31 NOTE — Telephone Encounter (Signed)
RNCM called in Rx as written to pharmacy of choice.

## 2022-03-31 NOTE — Progress Notes (Signed)
MFM Consultation  Wanda Fuller is a 36 yo G2P0 who is at 9w 4d with an EDD of 10/30/2022. She is seen at the request of Wanda Fuller due to a history of pulmonary embolism.  Wanda Fuller developed a pulmonsary embolism in 2017 after  long flight from [place]. She had negative LLE Dopplers but positive bilateral pulmonary emboli. She was treated with Xarelto and had no  additional events. She denies a personal history or family history of VTE.  I did not see a thrombophilia work up, but is lupus anticoagulant positive that was last measured in 2018.  Cost of Xarelto was challenging for her to remain on the medication and the hematologist Wanda Fuller suggest that life long Xarelto may be recommended given her positive LA and her elevated BMI.    03/31/2022    9:06 AM 03/30/2022   10:30 AM 03/30/2022   10:24 AM  Vitals with BMI  Height   '5\' 2"'$   Weight   264 lbs 2 oz  BMI   29.56  Systolic 213 086   Diastolic 78 87   Pulse 87 101       Latest Ref Rng & Units 10/09/2016    2:12 AM 09/19/2016    2:12 AM 09/18/2016    3:00 AM  CBC  WBC 4.0 - 10.5 K/uL 7.0  9.8  9.3   Hemoglobin 12.0 - 15.0 g/dL 10.5  12.1  11.7   Hematocrit 36.0 - 46.0 % 32.5  37.5  35.3   Platelets 150 - 400 K/uL 274  207  214       Latest Ref Rng & Units 10/09/2016    2:12 AM 09/19/2016    2:12 AM 09/18/2016    3:00 AM  CMP  Glucose 65 - 99 mg/dL 103  134  124   BUN 6 - 20 mg/dL 10  6  <5   Creatinine 0.44 - 1.00 mg/dL 0.76  0.65  0.57   Sodium 135 - 145 mmol/L 138  134  133   Potassium 3.5 - 5.1 mmol/L 4.2  3.5  3.6   Chloride 101 - 111 mmol/L 107  99  101   CO2 22 - 32 mmol/L '23  26  24   '$ Calcium 8.9 - 10.3 mg/dL 9.0  9.3  9.2    Past Medical History:  Diagnosis Date   Asthma    HSV-1 infection    Pulmonary embolism ([facility])    Past Surgical History:  Procedure Laterality Date   NO PAST SURGERIES        Current Outpatient Medications (Respiratory):    albuterol (PROVENTIL  HFA;VENTOLIN HFA) 108 (90 Base) MCG/ACT inhaler, Inhale 2 puffs into the lungs every 6 (six) hours as needed for wheezing or shortness of breath. (Patient not taking: Reported on 03/31/2022)    Current Outpatient Medications (Other):    Doxylamine-Pyridoxine (DICLEGIS PO), Take by mouth.   glycopyrrolate (ROBINUL) 1 MG tablet, Take 1 tablet (1 mg total) by mouth every 8 (eight) hours as needed (spitting). (Patient not taking: Reported on 03/31/2022)   pantoprazole (PROTONIX) 20 MG tablet, Take 1 tablet (20 mg total) by mouth daily. (Patient not taking: Reported on 03/31/2022)   Prenat-Fe Carbonyl-FA-Omega 3 (ONE-A-DAY WOMENS PRENATAL 1 PO), Take by mouth. No Known Allergies  No Known Allergies  Impression/Counseling:  I discussed with Wanda Fuller the diagnosis, evaluation and management of a prior thrombotic event outside of pregnancy.  We discussed that management is broken into  a few categories including the context of provoked, unprovoked and thrombophilia with/without subcategories of ongoing risk factors.   Wanda Fuller event seemed related to a long plane ride and elevated BMI. She did not have thrombophilia evaluation but has weakly elevated lupus anticoagulant.  I discussed the option of prophylactic Lovenox antepartum + postpartum vs two 81 mg low dose ASA + prophylactic Lovenox during the postpartum period. I discussed that the most conservative approach would be to use Lovenox prophylaxis during the antepartum and postpartum period.  The risk of recurrent event is uncertain but I suggest that it could be elevated given her BMI. The value of a positive LA one and was repeated 3 months later and suggested to be normal. At the conclusion of today's counseling Wanda Fuller desired to take aspirin 2x daily during the antepartum period and to take Lovenox 6 weeks postpartum.   At this time, to be prudent I recommend repeating her thrombophilia panel.  To include antiphospholipid  antibodies and Factor 5 Leiden, and prothrombin gene.  Hematologist consult with Wanda Fuller   If abnormal consider prophylactic Lovenox in the antepartum and postpartum periods.   Lastly, we discussed TWG of 11-20 lbs. Serial growth at 24/28/32/36 weeks. Initiate weekly testing at 34 weeks given elevated BMI. Consider early 1hrGTT.  All questions answered.  I spent 30 minutes with >50% in face to face consultation.   Wanda Ports,  MD

## 2022-04-15 DIAGNOSIS — N890 Mild vaginal dysplasia: Secondary | ICD-10-CM | POA: Diagnosis not present

## 2022-04-15 DIAGNOSIS — Z3481 Encounter for supervision of other normal pregnancy, first trimester: Secondary | ICD-10-CM | POA: Diagnosis not present

## 2022-04-15 DIAGNOSIS — Z315 Encounter for genetic counseling: Secondary | ICD-10-CM | POA: Diagnosis not present

## 2022-04-15 DIAGNOSIS — Z86711 Personal history of pulmonary embolism: Secondary | ICD-10-CM | POA: Diagnosis not present

## 2022-04-15 DIAGNOSIS — O09511 Supervision of elderly primigravida, first trimester: Secondary | ICD-10-CM | POA: Diagnosis not present

## 2022-04-15 DIAGNOSIS — N925 Other specified irregular menstruation: Secondary | ICD-10-CM | POA: Diagnosis not present

## 2022-04-15 DIAGNOSIS — N898 Other specified noninflammatory disorders of vagina: Secondary | ICD-10-CM | POA: Diagnosis not present

## 2022-04-15 DIAGNOSIS — Z369 Encounter for antenatal screening, unspecified: Secondary | ICD-10-CM | POA: Diagnosis not present

## 2022-04-15 LAB — OB RESULTS CONSOLE RPR: RPR: NONREACTIVE

## 2022-04-15 LAB — OB RESULTS CONSOLE RUBELLA ANTIBODY, IGM: Rubella: IMMUNE

## 2022-04-15 LAB — OB RESULTS CONSOLE HIV ANTIBODY (ROUTINE TESTING): HIV: NONREACTIVE

## 2022-04-15 LAB — OB RESULTS CONSOLE HEPATITIS B SURFACE ANTIGEN: Hepatitis B Surface Ag: NEGATIVE

## 2022-04-27 ENCOUNTER — Other Ambulatory Visit: Payer: Self-pay | Admitting: Advanced Practice Midwife

## 2022-04-29 DIAGNOSIS — Z86711 Personal history of pulmonary embolism: Secondary | ICD-10-CM | POA: Diagnosis not present

## 2022-04-29 DIAGNOSIS — Z369 Encounter for antenatal screening, unspecified: Secondary | ICD-10-CM | POA: Diagnosis not present

## 2022-06-03 ENCOUNTER — Other Ambulatory Visit: Payer: Self-pay

## 2022-06-03 ENCOUNTER — Other Ambulatory Visit: Payer: Self-pay | Admitting: Obstetrics and Gynecology

## 2022-06-03 DIAGNOSIS — Z363 Encounter for antenatal screening for malformations: Secondary | ICD-10-CM

## 2022-06-08 DIAGNOSIS — M778 Other enthesopathies, not elsewhere classified: Secondary | ICD-10-CM | POA: Diagnosis not present

## 2022-06-08 DIAGNOSIS — O9A212 Injury, poisoning and certain other consequences of external causes complicating pregnancy, second trimester: Secondary | ICD-10-CM | POA: Diagnosis not present

## 2022-06-08 DIAGNOSIS — G5601 Carpal tunnel syndrome, right upper limb: Secondary | ICD-10-CM | POA: Diagnosis not present

## 2022-06-08 DIAGNOSIS — Z3A19 19 weeks gestation of pregnancy: Secondary | ICD-10-CM | POA: Diagnosis not present

## 2022-06-16 ENCOUNTER — Ambulatory Visit: Payer: BC Managed Care – PPO | Attending: Obstetrics and Gynecology

## 2022-06-16 ENCOUNTER — Other Ambulatory Visit: Payer: Self-pay | Admitting: *Deleted

## 2022-06-16 ENCOUNTER — Ambulatory Visit: Payer: BC Managed Care – PPO | Attending: Maternal & Fetal Medicine | Admitting: Maternal & Fetal Medicine

## 2022-06-16 ENCOUNTER — Encounter: Payer: Self-pay | Admitting: *Deleted

## 2022-06-16 ENCOUNTER — Ambulatory Visit: Payer: BC Managed Care – PPO | Admitting: *Deleted

## 2022-06-16 VITALS — BP 138/80 | HR 113

## 2022-06-16 DIAGNOSIS — O98513 Other viral diseases complicating pregnancy, third trimester: Secondary | ICD-10-CM

## 2022-06-16 DIAGNOSIS — Z3689 Encounter for other specified antenatal screening: Secondary | ICD-10-CM

## 2022-06-16 DIAGNOSIS — Z363 Encounter for antenatal screening for malformations: Secondary | ICD-10-CM | POA: Diagnosis not present

## 2022-06-16 DIAGNOSIS — O09522 Supervision of elderly multigravida, second trimester: Secondary | ICD-10-CM

## 2022-06-16 DIAGNOSIS — Z86711 Personal history of pulmonary embolism: Secondary | ICD-10-CM

## 2022-06-16 DIAGNOSIS — O99212 Obesity complicating pregnancy, second trimester: Secondary | ICD-10-CM

## 2022-06-16 DIAGNOSIS — O132 Gestational [pregnancy-induced] hypertension without significant proteinuria, second trimester: Secondary | ICD-10-CM

## 2022-06-16 DIAGNOSIS — B009 Herpesviral infection, unspecified: Secondary | ICD-10-CM

## 2022-06-16 DIAGNOSIS — J45909 Unspecified asthma, uncomplicated: Secondary | ICD-10-CM

## 2022-06-16 DIAGNOSIS — Z3A20 20 weeks gestation of pregnancy: Secondary | ICD-10-CM

## 2022-06-16 DIAGNOSIS — O88212 Thromboembolism in pregnancy, second trimester: Secondary | ICD-10-CM

## 2022-06-16 DIAGNOSIS — Z6841 Body Mass Index (BMI) 40.0 and over, adult: Secondary | ICD-10-CM

## 2022-06-16 DIAGNOSIS — O99513 Diseases of the respiratory system complicating pregnancy, third trimester: Secondary | ICD-10-CM | POA: Diagnosis not present

## 2022-06-16 DIAGNOSIS — O4440 Low lying placenta NOS or without hemorrhage, unspecified trimester: Secondary | ICD-10-CM

## 2022-06-16 DIAGNOSIS — Z362 Encounter for other antenatal screening follow-up: Secondary | ICD-10-CM

## 2022-06-16 DIAGNOSIS — E669 Obesity, unspecified: Secondary | ICD-10-CM

## 2022-06-16 NOTE — Progress Notes (Signed)
MFM Brief Note  Wanda Fuller is a 36 yo G2P0 who is here for a detailed exam at the request of Elder Negus, CNM.  Her preganncy is complicated by elevated BMI, history of PE (please see consult previously performed) and advanced maternal age.  She has a low risk NIPS and neg horizon.  Single intrauterine pregnancy here for a detailed anatomy   Normal anatomy with measurements consistent with dates There is good fetal movement and amniotic fluid volume Suboptimal views of the fetal anatomy were obtained secondary to fetal position.  She was previously counseled regarding her prior PE, she repeat labs were performed and sere normal including FVL and APLAS.   Therefore she will not administer prophylactic Lovenox during the pregnancy but will consider it postpartum.  Secondly, today we observed a low lying placenta with < 1 cm from the internal os. I discussed the increased risk for bleeding therefore pelvic rest was advised.  I have  scheduled [name] to return for growth in 4 weeks to complete the fetal anatomy.  I spent 20 minutes with >50% in face to face consulation.  [name], MD.

## 2022-07-14 ENCOUNTER — Ambulatory Visit: Payer: BC Managed Care – PPO

## 2022-07-14 ENCOUNTER — Other Ambulatory Visit: Payer: Self-pay

## 2022-07-14 ENCOUNTER — Ambulatory Visit: Payer: BC Managed Care – PPO | Attending: Maternal & Fetal Medicine

## 2022-07-14 VITALS — BP 138/87 | HR 125

## 2022-07-14 DIAGNOSIS — O4442 Low lying placenta NOS or without hemorrhage, second trimester: Secondary | ICD-10-CM | POA: Diagnosis not present

## 2022-07-14 DIAGNOSIS — O09522 Supervision of elderly multigravida, second trimester: Secondary | ICD-10-CM | POA: Diagnosis not present

## 2022-07-14 DIAGNOSIS — Z362 Encounter for other antenatal screening follow-up: Secondary | ICD-10-CM | POA: Diagnosis not present

## 2022-07-14 DIAGNOSIS — Z86711 Personal history of pulmonary embolism: Secondary | ICD-10-CM | POA: Diagnosis not present

## 2022-07-14 DIAGNOSIS — Z3689 Encounter for other specified antenatal screening: Secondary | ICD-10-CM | POA: Insufficient documentation

## 2022-07-14 DIAGNOSIS — O4440 Low lying placenta NOS or without hemorrhage, unspecified trimester: Secondary | ICD-10-CM | POA: Diagnosis not present

## 2022-07-14 DIAGNOSIS — O99212 Obesity complicating pregnancy, second trimester: Secondary | ICD-10-CM | POA: Insufficient documentation

## 2022-07-14 DIAGNOSIS — B009 Herpesviral infection, unspecified: Secondary | ICD-10-CM

## 2022-07-14 DIAGNOSIS — J45909 Unspecified asthma, uncomplicated: Secondary | ICD-10-CM

## 2022-07-14 DIAGNOSIS — Z3A24 24 weeks gestation of pregnancy: Secondary | ICD-10-CM

## 2022-07-14 DIAGNOSIS — O98512 Other viral diseases complicating pregnancy, second trimester: Secondary | ICD-10-CM

## 2022-07-14 DIAGNOSIS — E669 Obesity, unspecified: Secondary | ICD-10-CM | POA: Diagnosis not present

## 2022-07-14 DIAGNOSIS — O99512 Diseases of the respiratory system complicating pregnancy, second trimester: Secondary | ICD-10-CM

## 2022-07-14 DIAGNOSIS — O132 Gestational [pregnancy-induced] hypertension without significant proteinuria, second trimester: Secondary | ICD-10-CM

## 2022-07-19 ENCOUNTER — Other Ambulatory Visit: Payer: Self-pay | Admitting: *Deleted

## 2022-07-19 DIAGNOSIS — O09522 Supervision of elderly multigravida, second trimester: Secondary | ICD-10-CM

## 2022-07-19 DIAGNOSIS — O99212 Obesity complicating pregnancy, second trimester: Secondary | ICD-10-CM

## 2022-07-23 DIAGNOSIS — R030 Elevated blood-pressure reading, without diagnosis of hypertension: Secondary | ICD-10-CM | POA: Diagnosis not present

## 2022-08-12 ENCOUNTER — Ambulatory Visit: Payer: BC Managed Care – PPO | Attending: Maternal & Fetal Medicine

## 2022-08-12 ENCOUNTER — Other Ambulatory Visit: Payer: Self-pay | Admitting: *Deleted

## 2022-08-12 ENCOUNTER — Ambulatory Visit: Payer: BC Managed Care – PPO | Admitting: *Deleted

## 2022-08-12 VITALS — BP 122/87 | HR 109

## 2022-08-12 DIAGNOSIS — E669 Obesity, unspecified: Secondary | ICD-10-CM | POA: Diagnosis not present

## 2022-08-12 DIAGNOSIS — Z3689 Encounter for other specified antenatal screening: Secondary | ICD-10-CM | POA: Insufficient documentation

## 2022-08-12 DIAGNOSIS — O09523 Supervision of elderly multigravida, third trimester: Secondary | ICD-10-CM | POA: Diagnosis not present

## 2022-08-12 DIAGNOSIS — O99212 Obesity complicating pregnancy, second trimester: Secondary | ICD-10-CM | POA: Insufficient documentation

## 2022-08-12 DIAGNOSIS — O09522 Supervision of elderly multigravida, second trimester: Secondary | ICD-10-CM | POA: Insufficient documentation

## 2022-08-12 DIAGNOSIS — B009 Herpesviral infection, unspecified: Secondary | ICD-10-CM

## 2022-08-12 DIAGNOSIS — J45909 Unspecified asthma, uncomplicated: Secondary | ICD-10-CM

## 2022-08-12 DIAGNOSIS — O99213 Obesity complicating pregnancy, third trimester: Secondary | ICD-10-CM

## 2022-08-12 DIAGNOSIS — O88213 Thromboembolism in pregnancy, third trimester: Secondary | ICD-10-CM

## 2022-08-12 DIAGNOSIS — O99513 Diseases of the respiratory system complicating pregnancy, third trimester: Secondary | ICD-10-CM | POA: Diagnosis not present

## 2022-08-12 DIAGNOSIS — O4440 Low lying placenta NOS or without hemorrhage, unspecified trimester: Secondary | ICD-10-CM

## 2022-08-12 DIAGNOSIS — Z6841 Body Mass Index (BMI) 40.0 and over, adult: Secondary | ICD-10-CM

## 2022-08-12 DIAGNOSIS — Z3A28 28 weeks gestation of pregnancy: Secondary | ICD-10-CM

## 2022-08-12 DIAGNOSIS — O98513 Other viral diseases complicating pregnancy, third trimester: Secondary | ICD-10-CM

## 2022-08-13 DIAGNOSIS — Z369 Encounter for antenatal screening, unspecified: Secondary | ICD-10-CM | POA: Diagnosis not present

## 2022-09-01 DIAGNOSIS — O99810 Abnormal glucose complicating pregnancy: Secondary | ICD-10-CM | POA: Diagnosis not present

## 2022-09-09 ENCOUNTER — Ambulatory Visit: Payer: BC Managed Care – PPO | Attending: Maternal & Fetal Medicine

## 2022-09-09 ENCOUNTER — Ambulatory Visit: Payer: BC Managed Care – PPO

## 2022-09-09 ENCOUNTER — Other Ambulatory Visit: Payer: Self-pay

## 2022-09-09 VITALS — BP 110/90 | HR 124

## 2022-09-09 DIAGNOSIS — E669 Obesity, unspecified: Secondary | ICD-10-CM | POA: Diagnosis not present

## 2022-09-09 DIAGNOSIS — Z3689 Encounter for other specified antenatal screening: Secondary | ICD-10-CM

## 2022-09-09 DIAGNOSIS — Z86711 Personal history of pulmonary embolism: Secondary | ICD-10-CM | POA: Insufficient documentation

## 2022-09-09 DIAGNOSIS — O99213 Obesity complicating pregnancy, third trimester: Secondary | ICD-10-CM

## 2022-09-09 DIAGNOSIS — O88213 Thromboembolism in pregnancy, third trimester: Secondary | ICD-10-CM | POA: Diagnosis not present

## 2022-09-09 DIAGNOSIS — Z3A32 32 weeks gestation of pregnancy: Secondary | ICD-10-CM

## 2022-09-09 DIAGNOSIS — O09523 Supervision of elderly multigravida, third trimester: Secondary | ICD-10-CM | POA: Diagnosis not present

## 2022-09-09 DIAGNOSIS — J45909 Unspecified asthma, uncomplicated: Secondary | ICD-10-CM

## 2022-09-09 DIAGNOSIS — O09522 Supervision of elderly multigravida, second trimester: Secondary | ICD-10-CM | POA: Insufficient documentation

## 2022-09-09 DIAGNOSIS — O99212 Obesity complicating pregnancy, second trimester: Secondary | ICD-10-CM | POA: Diagnosis not present

## 2022-09-09 DIAGNOSIS — O99513 Diseases of the respiratory system complicating pregnancy, third trimester: Secondary | ICD-10-CM

## 2022-09-13 DIAGNOSIS — M545 Low back pain, unspecified: Secondary | ICD-10-CM | POA: Diagnosis not present

## 2022-09-16 ENCOUNTER — Inpatient Hospital Stay (HOSPITAL_COMMUNITY)
Admission: AD | Admit: 2022-09-16 | Discharge: 2022-09-20 | DRG: 831 | Disposition: A | Discharge status: Home / Self Care | Payer: BC Managed Care – PPO | Attending: Obstetrics and Gynecology | Admitting: Obstetrics and Gynecology

## 2022-09-16 ENCOUNTER — Inpatient Hospital Stay (HOSPITAL_COMMUNITY): Payer: BC Managed Care – PPO

## 2022-09-16 ENCOUNTER — Encounter (HOSPITAL_COMMUNITY): Payer: Self-pay | Admitting: Obstetrics and Gynecology

## 2022-09-16 ENCOUNTER — Other Ambulatory Visit: Payer: Self-pay

## 2022-09-16 ENCOUNTER — Ambulatory Visit (HOSPITAL_BASED_OUTPATIENT_CLINIC_OR_DEPARTMENT_OTHER): Payer: BC Managed Care – PPO

## 2022-09-16 ENCOUNTER — Ambulatory Visit: Payer: BC Managed Care – PPO | Admitting: *Deleted

## 2022-09-16 DIAGNOSIS — A6000 Herpesviral infection of urogenital system, unspecified: Secondary | ICD-10-CM | POA: Diagnosis present

## 2022-09-16 DIAGNOSIS — R051 Acute cough: Secondary | ICD-10-CM | POA: Diagnosis not present

## 2022-09-16 DIAGNOSIS — O88813 Other embolism in pregnancy, third trimester: Principal | ICD-10-CM | POA: Diagnosis present

## 2022-09-16 DIAGNOSIS — O4440 Low lying placenta NOS or without hemorrhage, unspecified trimester: Secondary | ICD-10-CM

## 2022-09-16 DIAGNOSIS — Z6841 Body Mass Index (BMI) 40.0 and over, adult: Secondary | ICD-10-CM | POA: Insufficient documentation

## 2022-09-16 DIAGNOSIS — O09523 Supervision of elderly multigravida, third trimester: Secondary | ICD-10-CM

## 2022-09-16 DIAGNOSIS — R0789 Other chest pain: Secondary | ICD-10-CM

## 2022-09-16 DIAGNOSIS — O88213 Thromboembolism in pregnancy, third trimester: Secondary | ICD-10-CM | POA: Insufficient documentation

## 2022-09-16 DIAGNOSIS — Z3A33 33 weeks gestation of pregnancy: Secondary | ICD-10-CM

## 2022-09-16 DIAGNOSIS — I2699 Other pulmonary embolism without acute cor pulmonale: Principal | ICD-10-CM | POA: Diagnosis present

## 2022-09-16 DIAGNOSIS — Z362 Encounter for other antenatal screening follow-up: Secondary | ICD-10-CM

## 2022-09-16 DIAGNOSIS — Z23 Encounter for immunization: Secondary | ICD-10-CM

## 2022-09-16 DIAGNOSIS — D1803 Hemangioma of intra-abdominal structures: Secondary | ICD-10-CM | POA: Diagnosis present

## 2022-09-16 DIAGNOSIS — O99513 Diseases of the respiratory system complicating pregnancy, third trimester: Secondary | ICD-10-CM

## 2022-09-16 DIAGNOSIS — O99213 Obesity complicating pregnancy, third trimester: Secondary | ICD-10-CM | POA: Diagnosis present

## 2022-09-16 DIAGNOSIS — Z20822 Contact with and (suspected) exposure to covid-19: Secondary | ICD-10-CM | POA: Diagnosis present

## 2022-09-16 DIAGNOSIS — Z7901 Long term (current) use of anticoagulants: Secondary | ICD-10-CM

## 2022-09-16 DIAGNOSIS — O99013 Anemia complicating pregnancy, third trimester: Secondary | ICD-10-CM | POA: Diagnosis present

## 2022-09-16 DIAGNOSIS — Z87891 Personal history of nicotine dependence: Secondary | ICD-10-CM

## 2022-09-16 DIAGNOSIS — J45909 Unspecified asthma, uncomplicated: Secondary | ICD-10-CM

## 2022-09-16 DIAGNOSIS — O133 Gestational [pregnancy-induced] hypertension without significant proteinuria, third trimester: Secondary | ICD-10-CM | POA: Diagnosis present

## 2022-09-16 DIAGNOSIS — O98313 Other infections with a predominantly sexual mode of transmission complicating pregnancy, third trimester: Secondary | ICD-10-CM | POA: Diagnosis present

## 2022-09-16 DIAGNOSIS — O99891 Other specified diseases and conditions complicating pregnancy: Secondary | ICD-10-CM | POA: Diagnosis present

## 2022-09-16 DIAGNOSIS — E669 Obesity, unspecified: Secondary | ICD-10-CM

## 2022-09-16 DIAGNOSIS — Z86711 Personal history of pulmonary embolism: Secondary | ICD-10-CM

## 2022-09-16 DIAGNOSIS — I517 Cardiomegaly: Secondary | ICD-10-CM | POA: Diagnosis present

## 2022-09-16 DIAGNOSIS — R7401 Elevation of levels of liver transaminase levels: Secondary | ICD-10-CM | POA: Diagnosis present

## 2022-09-16 LAB — CBC
HCT: 27.1 % — ABNORMAL LOW (ref 36.0–46.0)
Hemoglobin: 8.8 g/dL — ABNORMAL LOW (ref 12.0–15.0)
MCH: 26.7 pg (ref 26.0–34.0)
MCHC: 32.5 g/dL (ref 30.0–36.0)
MCV: 82.4 fL (ref 80.0–100.0)
Platelets: 204 10*3/uL (ref 150–400)
RBC: 3.29 MIL/uL — ABNORMAL LOW (ref 3.87–5.11)
RDW: 15.1 % (ref 11.5–15.5)
WBC: 4.7 10*3/uL (ref 4.0–10.5)
nRBC: 0.4 % — ABNORMAL HIGH (ref 0.0–0.2)

## 2022-09-16 LAB — COMPREHENSIVE METABOLIC PANEL
ALT: 56 U/L — ABNORMAL HIGH (ref 0–44)
AST: 42 U/L — ABNORMAL HIGH (ref 15–41)
Albumin: 2.5 g/dL — ABNORMAL LOW (ref 3.5–5.0)
Alkaline Phosphatase: 104 U/L (ref 38–126)
Anion gap: 10 (ref 5–15)
BUN: 6 mg/dL (ref 6–20)
CO2: 20 mmol/L — ABNORMAL LOW (ref 22–32)
Calcium: 9.2 mg/dL (ref 8.9–10.3)
Chloride: 105 mmol/L (ref 98–111)
Creatinine, Ser: 0.57 mg/dL (ref 0.44–1.00)
GFR, Estimated: >60 mL/min (ref >60)
Glucose, Bld: 96 mg/dL (ref 70–99)
Potassium: 3.9 mmol/L (ref 3.5–5.1)
Sodium: 135 mmol/L (ref 135–145)
Total Bilirubin: 0.5 mg/dL (ref 0.3–1.2)
Total Protein: 6.2 g/dL — ABNORMAL LOW (ref 6.5–8.1)

## 2022-09-16 LAB — RESP PANEL BY RT-PCR (RSV, FLU A&B, COVID)  RVPGX2
Influenza A by PCR: NEGATIVE
Influenza B by PCR: NEGATIVE
Resp Syncytial Virus by PCR: NEGATIVE
SARS Coronavirus 2 by RT PCR: NEGATIVE

## 2022-09-16 LAB — PROTEIN / CREATININE RATIO, URINE
Creatinine, Urine: 134 mg/dL
Protein Creatinine Ratio: 0.13 mg/mg{Cre} (ref 0.00–0.15)
Total Protein, Urine: 18 mg/dL

## 2022-09-16 MED ORDER — IOHEXOL 350 MG/ML SOLN
75.0000 mL | Freq: Once | INTRAVENOUS | Status: AC | PRN
  Administered 2022-09-16: 75 mL via INTRAVENOUS

## 2022-09-16 NOTE — MAU Provider Note (Addendum)
History     CSN: [id number]  Arrival date and time: 09/16/22 1837   None     Chief Complaint  Patient presents with   Cough   Wanda Fuller is a 36 y.o. G2P0010 at 59w5dwho presents with 4 days of cough, tactile fever, shortness of breath, and chest tightness. She has a hx of asthma, pneumonia, and PE. She is worried that these things are occurring again. Over the last few days her symptoms have worsened. She also endorses chronic BLE edema since 10 weeks of pregnancy. She reports good fetal movement, no VB, or LOF. She has occasional headaches, has seen black spots, and states her BP has been elevated before.   OB History     Gravida  2   Para      Term      Preterm      AB  1   Living  0      SAB      IAB  1   Ectopic      Multiple      Live Births  0           Past Medical History:  Diagnosis Date   Asthma    HSV-1 infection    Pulmonary embolism (HCC)     Past Surgical History:  Procedure Laterality Date   NO PAST SURGERIES      Family History  Problem Relation Age of Onset   Hypertension Mother    Cancer Maternal Aunt    Cancer Paternal Uncle    Cancer Paternal Grandmother    Asthma Neg Hx    Diabetes Neg Hx    Heart disease Neg Hx    Stroke Neg Hx     Social History   Tobacco Use   Smoking status: Former   Smokeless tobacco: Never  VScientific laboratory technicianUse: Never used  Substance Use Topics   Alcohol use: Not Currently   Drug use: No    Allergies: No Known Allergies  Medications Prior to Admission  Medication Sig Dispense Refill Last Dose   albuterol (PROVENTIL HFA;VENTOLIN HFA) 108 (90 Base) MCG/ACT inhaler Inhale 2 puffs into the lungs every 6 (six) hours as needed for wheezing or shortness of breath.   09/15/2022   Prenat-Fe Carbonyl-FA-Omega 3 (ONE-A-DAY WOMENS PRENATAL 1 PO) Take by mouth.   09/15/2022 at 2100   Doxylamine-Pyridoxine (DICLEGIS PO) Take by mouth. (Patient not taking: Reported on 09/16/2022)    Not Taking   ferrous sulfate 325 (65 FE) MG tablet Take 325 mg by mouth daily with breakfast.      glycopyrrolate (ROBINUL) 1 MG tablet Take 1 tablet (1 mg total) by mouth every 8 (eight) hours as needed (spitting). (Patient not taking: Reported on 03/31/2022) 30 tablet 1    pantoprazole (PROTONIX) 20 MG tablet Take 1 tablet (20 mg total) by mouth daily. (Patient not taking: Reported on 03/31/2022) 30 tablet 0     Review of Systems  Constitutional:  Positive for chills and fever.  Eyes:  Positive for visual disturbance.  Respiratory:  Positive for cough, chest tightness and shortness of breath.   Cardiovascular:  Positive for leg swelling.  Gastrointestinal:  Positive for nausea. Negative for abdominal pain and vomiting.  Genitourinary:  Negative for vaginal bleeding.  Neurological:  Positive for headaches.   Physical Exam   Blood pressure 139/72, pulse (!) 118, temperature 99 F (37.2 C), temperature source Oral, resp. rate 20, height '5\' 2"'$  (1.575 m),  weight (!) 141.8 kg, last menstrual period 01/26/2022, SpO2 100 %. Vitals:   09/16/22 2113 09/16/22 2131 09/16/22 2330 09/16/22 2345  BP: 133/84 (!) 149/94 138/75 134/66  Pulse: (!) 113 (!) 118 (!) 120 (!) 117  Resp:      Temp:      TempSrc:      SpO2:   99% 98%  Weight:      Height:        NST:  Baseline: 150 bpm, Variability: Good {> 6 bpm), Accelerations: Reactive, Decelerations: Absent, and CTX: irritability  Physical Exam Constitutional:      General: She is not in acute distress.    Appearance: She is not ill-appearing, toxic-appearing or diaphoretic.  Eyes:     Conjunctiva/sclera: Conjunctivae normal.  Cardiovascular:     Rate and Rhythm: Regular rhythm. Tachycardia present.     Heart sounds: Normal heart sounds.  Pulmonary:     Effort: No respiratory distress.     Breath sounds: Normal breath sounds. No wheezing.     Comments: Increased work of breathing Musculoskeletal:     Right lower leg: Edema present.     Left  lower leg: Edema present.  Neurological:     Mental Status: She is alert and oriented to person, place, and time.  Psychiatric:        Mood and Affect: Mood normal.        Behavior: Behavior normal.    MAU Course  Procedures  MDM Hx of asthma, PNA, and PE. No on aspirin. Symptoms not improved with albuterol inhaler. Visibly increased work of breathing.  Elevated BP readings with recent vision changes. Reviewed H&P from 2017 hospitalization for PE. Provoked by trip from [place].  Obtain CBC, CMP, UPCr, CXR, and respiratory panel. Wells score is 6 consider CTA.   2135-Respiratory panel negative. CBC wnl. CXR unremarkable. Discussed patient with Dr. Charlesetta Garibaldi; agrees to proceed with CTA chest. CT radiation consent form signed.  1008-Labs remarkable for preE w/ SF. Plan to discuss with attending when CTA back. Care transferred to [name], CNM.   2315 Returned from Radiology Received call from Radiologist reporting patient has bilateral pulmonary emboli and right heart strain Also has a hemangioma of liver, previously seen in 2017, but bigger now  Dr Charlesetta Garibaldi updated. She will consult MFM and arrange admission  Assessment and Plan  A:  Single IUP at [redacted]w[redacted]d      Bilateral pulmonary emboli       Right heart strain       Hepatic Hemangioma       Elevated transaminases  P:   [facility]        Orders per Dr DCharlesetta Garibaldi who has consulted with MFM         MD to follow  WSeabron Spates CNM   [name] 09/16/2022, 7:26 PM

## 2022-09-16 NOTE — MAU Provider Note (Incomplete Revision)
History     CSN: [id number]  Arrival date and time: 09/16/22 1837   None     Chief Complaint  Patient presents with   Cough   Wanda Fuller is a 36 y.o. G2P0010 at 65w5dwho presents with 4 days of cough, tactile fever, shortness of breath, and chest tightness. She has a hx of asthma, pneumonia, and PE. She is worried that these things are occurring again. Over the last few days her symptoms have worsened. She also endorses chronic BLE edema since 10 weeks of pregnancy. She reports good fetal movement, no VB, or LOF. She has occasional headaches, has seen black spots, and states her BP has been elevated before.   OB History     Gravida  2   Para      Term      Preterm      AB  1   Living  0      SAB      IAB  1   Ectopic      Multiple      Live Births  0           Past Medical History:  Diagnosis Date   Asthma    HSV-1 infection    Pulmonary embolism (HCC)     Past Surgical History:  Procedure Laterality Date   NO PAST SURGERIES      Family History  Problem Relation Age of Onset   Hypertension Mother    Cancer Maternal Aunt    Cancer Paternal Uncle    Cancer Paternal Grandmother    Asthma Neg Hx    Diabetes Neg Hx    Heart disease Neg Hx    Stroke Neg Hx     Social History   Tobacco Use   Smoking status: Former   Smokeless tobacco: Never  VScientific laboratory technicianUse: Never used  Substance Use Topics   Alcohol use: Not Currently   Drug use: No    Allergies: No Known Allergies  Medications Prior to Admission  Medication Sig Dispense Refill Last Dose   albuterol (PROVENTIL HFA;VENTOLIN HFA) 108 (90 Base) MCG/ACT inhaler Inhale 2 puffs into the lungs every 6 (six) hours as needed for wheezing or shortness of breath.   09/15/2022   Prenat-Fe Carbonyl-FA-Omega 3 (ONE-A-DAY WOMENS PRENATAL 1 PO) Take by mouth.   09/15/2022 at 2100   Doxylamine-Pyridoxine (DICLEGIS PO) Take by mouth. (Patient not taking: Reported on 09/16/2022)    Not Taking   ferrous sulfate 325 (65 FE) MG tablet Take 325 mg by mouth daily with breakfast.      glycopyrrolate (ROBINUL) 1 MG tablet Take 1 tablet (1 mg total) by mouth every 8 (eight) hours as needed (spitting). (Patient not taking: Reported on 03/31/2022) 30 tablet 1    pantoprazole (PROTONIX) 20 MG tablet Take 1 tablet (20 mg total) by mouth daily. (Patient not taking: Reported on 03/31/2022) 30 tablet 0     Review of Systems  Constitutional:  Positive for chills and fever.  Eyes:  Positive for visual disturbance.  Respiratory:  Positive for cough, chest tightness and shortness of breath.   Cardiovascular:  Positive for leg swelling.  Gastrointestinal:  Positive for nausea. Negative for abdominal pain and vomiting.  Genitourinary:  Negative for vaginal bleeding.  Neurological:  Positive for headaches.   Physical Exam   Blood pressure 139/72, pulse (!) 118, temperature 99 F (37.2 C), temperature source Oral, resp. rate 20, height '5\' 2"'$  (1.575 m),  weight (!) 141.8 kg, last menstrual period 01/26/2022, SpO2 100 %. NST:  Baseline: 150 bpm, Variability: Good {> 6 bpm), Accelerations: Reactive, Decelerations: Absent, and CTX: irritability  Physical Exam Constitutional:      General: She is not in acute distress.    Appearance: She is not ill-appearing, toxic-appearing or diaphoretic.  Eyes:     Conjunctiva/sclera: Conjunctivae normal.  Cardiovascular:     Rate and Rhythm: Regular rhythm. Tachycardia present.     Heart sounds: Normal heart sounds.  Pulmonary:     Effort: No respiratory distress.     Breath sounds: Normal breath sounds. No wheezing.     Comments: Increased work of breathing Musculoskeletal:     Right lower leg: Edema present.     Left lower leg: Edema present.  Neurological:     Mental Status: She is alert and oriented to person, place, and time.  Psychiatric:        Mood and Affect: Mood normal.        Behavior: Behavior normal.    MAU Course   Procedures  MDM Hx of asthma, PNA, and PE. No on aspirin. Symptoms not improved with albuterol inhaler. Visibly increased work of breathing.  Elevated BP readings with recent vision changes. Reviewed H&P from 2017 hospitalization for PE. Provoked by trip from [place].  Obtain CBC, CMP, UPCr, CXR, and respiratory panel. Wells score is 6 consider CTA.   2135-Respiratory panel negative. CBC wnl. CXR unremarkable. Discussed patient with Dr. Charlesetta Garibaldi; agrees to proceed with CTA chest. CT radiation consent form signed.  1008-Labs remarkable for preE w/ SF. Plan to discuss with attending when CTA back. Care transferred to [name], CNM.   2315 Returned from Radiology Received call from Radiologist reporting patient has bilateral pulmonary emboli and right heart strain Also has a hemangioma of liver, previously seen in 2017, but bigger now  Dr Charlesetta Garibaldi updated. She will consult MFM and arrange admission  Assessment and Plan    Wanda Fuller 09/16/2022, 7:26 PM

## 2022-09-16 NOTE — MAU Note (Signed)
Jannessa Stanko is a 36 y.o. at 6w5dhere in MAU reporting: last 2 days been having a dry cough.  Is getting worse.  Feeling a tightness in her throat and chest when she coughs.   Feels like she is breathing harder, at times short of breath. No body aches, has not checked temp.  Has a hx of pneumonia- 89yrago and was dx with Pulm Emb, also as a child. With her being 33 wks preg, wanted to make sure she wasn't headed in that direction. Has had HA on and off, nothing severe, only lasts a couple of minutes. No HA currently. No bleeding or leaking. Reports +FM Tried her inhaler yesterday, helped briefly Onset of complaint: Monday Pain score:mild tightness in her throat There were no vitals filed for this visit.    Lab orders placed from triage:  none

## 2022-09-17 ENCOUNTER — Inpatient Hospital Stay (HOSPITAL_COMMUNITY): Payer: BC Managed Care – PPO

## 2022-09-17 DIAGNOSIS — Z86711 Personal history of pulmonary embolism: Secondary | ICD-10-CM | POA: Diagnosis not present

## 2022-09-17 DIAGNOSIS — Z3A33 33 weeks gestation of pregnancy: Secondary | ICD-10-CM

## 2022-09-17 DIAGNOSIS — I517 Cardiomegaly: Secondary | ICD-10-CM

## 2022-09-17 DIAGNOSIS — R051 Acute cough: Secondary | ICD-10-CM

## 2022-09-17 DIAGNOSIS — O99891 Other specified diseases and conditions complicating pregnancy: Secondary | ICD-10-CM | POA: Diagnosis present

## 2022-09-17 DIAGNOSIS — D509 Iron deficiency anemia, unspecified: Secondary | ICD-10-CM

## 2022-09-17 DIAGNOSIS — Z20822 Contact with and (suspected) exposure to covid-19: Secondary | ICD-10-CM | POA: Diagnosis present

## 2022-09-17 DIAGNOSIS — Z7901 Long term (current) use of anticoagulants: Secondary | ICD-10-CM | POA: Diagnosis not present

## 2022-09-17 DIAGNOSIS — I2609 Other pulmonary embolism with acute cor pulmonale: Secondary | ICD-10-CM | POA: Diagnosis not present

## 2022-09-17 DIAGNOSIS — I2699 Other pulmonary embolism without acute cor pulmonale: Secondary | ICD-10-CM

## 2022-09-17 DIAGNOSIS — O163 Unspecified maternal hypertension, third trimester: Secondary | ICD-10-CM | POA: Diagnosis not present

## 2022-09-17 DIAGNOSIS — O09523 Supervision of elderly multigravida, third trimester: Secondary | ICD-10-CM | POA: Diagnosis not present

## 2022-09-17 DIAGNOSIS — O99013 Anemia complicating pregnancy, third trimester: Secondary | ICD-10-CM

## 2022-09-17 DIAGNOSIS — D1803 Hemangioma of intra-abdominal structures: Secondary | ICD-10-CM

## 2022-09-17 DIAGNOSIS — O88213 Thromboembolism in pregnancy, third trimester: Secondary | ICD-10-CM | POA: Diagnosis not present

## 2022-09-17 DIAGNOSIS — Z87891 Personal history of nicotine dependence: Secondary | ICD-10-CM | POA: Diagnosis not present

## 2022-09-17 DIAGNOSIS — O99213 Obesity complicating pregnancy, third trimester: Secondary | ICD-10-CM | POA: Diagnosis present

## 2022-09-17 DIAGNOSIS — R0789 Other chest pain: Secondary | ICD-10-CM | POA: Diagnosis not present

## 2022-09-17 DIAGNOSIS — O88813 Other embolism in pregnancy, third trimester: Secondary | ICD-10-CM | POA: Diagnosis present

## 2022-09-17 DIAGNOSIS — R7401 Elevation of levels of liver transaminase levels: Secondary | ICD-10-CM

## 2022-09-17 DIAGNOSIS — A6000 Herpesviral infection of urogenital system, unspecified: Secondary | ICD-10-CM | POA: Diagnosis present

## 2022-09-17 DIAGNOSIS — Z23 Encounter for immunization: Secondary | ICD-10-CM | POA: Diagnosis not present

## 2022-09-17 DIAGNOSIS — O133 Gestational [pregnancy-induced] hypertension without significant proteinuria, third trimester: Secondary | ICD-10-CM | POA: Diagnosis present

## 2022-09-17 DIAGNOSIS — O98313 Other infections with a predominantly sexual mode of transmission complicating pregnancy, third trimester: Secondary | ICD-10-CM | POA: Diagnosis present

## 2022-09-17 LAB — CBC
HCT: 25.6 % — ABNORMAL LOW (ref 36.0–46.0)
Hemoglobin: 8.3 g/dL — ABNORMAL LOW (ref 12.0–15.0)
MCH: 26.8 pg (ref 26.0–34.0)
MCHC: 32.4 g/dL (ref 30.0–36.0)
MCV: 82.6 fL (ref 80.0–100.0)
Platelets: 198 10*3/uL (ref 150–400)
RBC: 3.1 MIL/uL — ABNORMAL LOW (ref 3.87–5.11)
RDW: 15.2 % (ref 11.5–15.5)
WBC: 5.9 10*3/uL (ref 4.0–10.5)
nRBC: 0.5 % — ABNORMAL HIGH (ref 0.0–0.2)

## 2022-09-17 LAB — COMPREHENSIVE METABOLIC PANEL
ALT: 66 U/L — ABNORMAL HIGH (ref 0–44)
AST: 50 U/L — ABNORMAL HIGH (ref 15–41)
Albumin: 2.3 g/dL — ABNORMAL LOW (ref 3.5–5.0)
Alkaline Phosphatase: 103 U/L (ref 38–126)
Anion gap: 10 (ref 5–15)
BUN: <5 mg/dL — ABNORMAL LOW (ref 6–20)
CO2: 19 mmol/L — ABNORMAL LOW (ref 22–32)
Calcium: 9.5 mg/dL (ref 8.9–10.3)
Chloride: 104 mmol/L (ref 98–111)
Creatinine, Ser: 0.62 mg/dL (ref 0.44–1.00)
GFR, Estimated: >60 mL/min (ref >60)
Glucose, Bld: 158 mg/dL — ABNORMAL HIGH (ref 70–99)
Potassium: 3.7 mmol/L (ref 3.5–5.1)
Sodium: 133 mmol/L — ABNORMAL LOW (ref 135–145)
Total Bilirubin: 0.5 mg/dL (ref 0.3–1.2)
Total Protein: 5.9 g/dL — ABNORMAL LOW (ref 6.5–8.1)

## 2022-09-17 LAB — GLUCOSE, CAPILLARY
Glucose-Capillary: 117 mg/dL — ABNORMAL HIGH (ref 70–99)
Glucose-Capillary: 130 mg/dL — ABNORMAL HIGH (ref 70–99)

## 2022-09-17 LAB — TYPE AND SCREEN
ABO/RH(D): A POS
Antibody Screen: NEGATIVE

## 2022-09-17 LAB — ECHOCARDIOGRAM COMPLETE
Height: 62 in
S' Lateral: 2 cm
Weight: 5001.6 oz

## 2022-09-17 LAB — ABO/RH: ABO/RH(D): A POS

## 2022-09-17 LAB — FERRITIN: Ferritin: 18 ng/mL (ref 11–307)

## 2022-09-17 MED ORDER — DOCUSATE SODIUM 100 MG PO CAPS: 100.0000 mg | ORAL_CAPSULE | Freq: Two times a day (BID) | ORAL | Status: DC

## 2022-09-17 MED ORDER — DOCUSATE SODIUM 100 MG PO CAPS
100.0000 mg | ORAL_CAPSULE | Freq: Every day | ORAL | Status: DC
  Administered 2022-09-17 – 2022-09-20 (×4): 100 mg via ORAL
  Filled 2022-09-17 (×4): qty 1

## 2022-09-17 MED ORDER — BETAMETHASONE SOD PHOS & ACET 6 (3-3) MG/ML IJ SUSP
12.0000 mg | INTRAMUSCULAR | Status: AC
  Administered 2022-09-17 – 2022-09-18 (×2): 12 mg via INTRAMUSCULAR
  Filled 2022-09-17: qty 5

## 2022-09-17 MED ORDER — LACTATED RINGERS IV SOLN: 125.0000 mL/h | INTRAVENOUS | Status: AC

## 2022-09-17 MED ORDER — ACETAMINOPHEN 325 MG PO TABS
650.0000 mg | ORAL_TABLET | ORAL | Status: DC | PRN
  Administered 2022-09-17 – 2022-09-19 (×2): 650 mg via ORAL
  Filled 2022-09-17 (×2): qty 2

## 2022-09-17 MED ORDER — INSULIN ASPART 100 UNIT/ML IJ SOLN
0.0000 [IU] | Freq: Three times a day (TID) | INTRAMUSCULAR | Status: DC
  Administered 2022-09-17: 3 [IU] via SUBCUTANEOUS
  Administered 2022-09-17 – 2022-09-18 (×3): 2 [IU] via SUBCUTANEOUS
  Administered 2022-09-18: 3 [IU] via SUBCUTANEOUS
  Administered 2022-09-19: 2 [IU] via SUBCUTANEOUS

## 2022-09-17 MED ORDER — FERROUS SULFATE 325 (65 FE) MG PO TABS
325.0000 mg | ORAL_TABLET | Freq: Two times a day (BID) | ORAL | Status: DC
  Administered 2022-09-17 – 2022-09-20 (×7): 325 mg via ORAL
  Filled 2022-09-17 (×8): qty 1

## 2022-09-17 MED ORDER — SODIUM CHLORIDE 0.9 % IV SOLN
500.0000 mg | Freq: Once | INTRAVENOUS | Status: AC
  Administered 2022-09-17: 500 mg via INTRAVENOUS
  Filled 2022-09-17: qty 500

## 2022-09-17 MED ORDER — PRENATAL MULTIVITAMIN CH
1.0000 | ORAL_TABLET | Freq: Every day | ORAL | Status: DC
  Administered 2022-09-17 – 2022-09-20 (×4): 1 via ORAL
  Filled 2022-09-17 (×4): qty 1

## 2022-09-17 MED ORDER — ENOXAPARIN SODIUM 150 MG/ML IJ SOSY
150.0000 mg | PREFILLED_SYRINGE | Freq: Two times a day (BID) | INTRAMUSCULAR | Status: DC
  Administered 2022-09-17 – 2022-09-20 (×8): 150 mg via SUBCUTANEOUS
  Filled 2022-09-17 (×9): qty 1

## 2022-09-17 MED ORDER — VALACYCLOVIR HCL 500 MG PO TABS
500.0000 mg | ORAL_TABLET | Freq: Two times a day (BID) | ORAL | Status: DC
  Administered 2022-09-17 – 2022-09-20 (×7): 500 mg via ORAL
  Filled 2022-09-17 (×7): qty 1

## 2022-09-17 MED ORDER — LACTATED RINGERS IV SOLN
INTRAVENOUS | Status: DC
  Administered 2022-09-17 – 2022-09-18 (×3): 125 mL/h via INTRAVENOUS

## 2022-09-17 MED ORDER — CALCIUM CARBONATE ANTACID 500 MG PO CHEW: 2.0000 | CHEWABLE_TABLET | ORAL | Status: DC | PRN

## 2022-09-17 MED ORDER — HYDROCOD POLI-CHLORPHE POLI ER 10-8 MG/5ML PO SUER: 5.0000 mL | Freq: Every evening | ORAL | Status: DC | PRN

## 2022-09-17 MED ORDER — RSV PRE-FUSION F A&B VAC RCMB 120 MCG/0.5ML IM SOLR
0.5000 mL | Freq: Once | INTRAMUSCULAR | Status: DC
  Filled 2022-09-17: qty 0.5

## 2022-09-17 MED ORDER — RSV PRE-FUSION F A&B VAC RCMB 120 MCG/0.5ML IM SOLR
0.5000 mL | Freq: Once | INTRAMUSCULAR | Status: AC
  Administered 2022-09-17: 0.5 mL via INTRAMUSCULAR
  Filled 2022-09-17: qty 0.5

## 2022-09-17 MED ORDER — GUAIFENESIN 100 MG/5ML PO LIQD
5.0000 mL | Freq: Four times a day (QID) | ORAL | Status: DC | PRN
  Administered 2022-09-17 – 2022-09-18 (×4): 5 mL via ORAL
  Filled 2022-09-17 (×4): qty 15

## 2022-09-17 NOTE — Progress Notes (Signed)
Lower extremity venous bilateral study completed.  Preliminary results relayed to provider on the floor and Chrys Racer, Therapist, sports.   See CV Proc for preliminary results report.   Darlin Coco, RDMS, RVT

## 2022-09-17 NOTE — H&P (Signed)
[name] is 01-28-1986  Patient's last menstrual period was 01/26/2022. [redacted]w[redacted]d Pt presents with Pulmonary embolism during puerperium [O88.23]. She has had a cough for a week and it has become worse.  Non productive no wheezing.  Pt has H/O of PE in 2017. She had a full work up that was negative.    Pregnancy complicated by H/O PE Asthma  PMHX  Past Medical History:  Diagnosis Date   Asthma    HSV-1 infection    Pulmonary embolism ([facility]     PSURG HX  Past Surgical History:  Procedure Laterality Date   NO PAST SURGERIES      Fam HX  Family History  Problem Relation Age of Onset   Hypertension Mother    Cancer Maternal Aunt    Cancer Paternal Uncle    Cancer Paternal Grandmother    Asthma Neg Hx    Diabetes Neg Hx    Heart disease Neg Hx    Stroke Neg Hx     Soc HX  Social History   Socioeconomic History   Marital status: Single    Spouse name: Not on file   Number of children: Not on file   Years of education: Not on file   Highest education level: Not on file  Occupational History   Not on file  Tobacco Use   Smoking status: Former   Smokeless tobacco: Never  Vaping Use   Vaping Use: Never used  Substance and Sexual Activity   Alcohol use: Not Currently   Drug use: No   Sexual activity: Yes    Birth control/protection: None  Other Topics Concern   Not on file  Social History Narrative   Not on file   Social Determinants of Health   Financial Resource Strain: Not on file  Food Insecurity: Not on file  Transportation Needs: Not on file  Physical Activity: Not on file  Stress: Not on file  Social Connections: Not on file  Intimate Partner Violence: Not on file    ROS  Review of Systems - Negative except above  [place] vtx 8/8  Gen WDWN  IN NAD CV RRR LUNGS CTAB ABD Gravid soft NT GU long and closed EXT Homans sign negative A/P: IUP 338w6dilateral PE Admit to antepartum Normal O2 sat on room air Start lovenox.  Pt aware  of the risks Give RSV vaccine and BMZ just in case delivery occurs. NICU teamaware of admission NICU and MFM in the morning Pt may have a regular diet  Robitussin for the cough Pt may have gestational HTN.  Labs stable monitor closely Fetal status reassuring.  The pt has contractions q 2-4 min.  No cx change. Hope it will stop once hydrated Will do telemetry, echo and LE dopplers this morning Dr Wanda Abbottonsulted . I appreciate his help with this pt

## 2022-09-17 NOTE — Progress Notes (Signed)
Echocardiogram 2D Echocardiogram has been performed.  Oneal Deputy Legend Tumminello RDCS 09/17/2022, 11:56 AM

## 2022-09-17 NOTE — Consult Note (Addendum)
MFM Consult Note Patient Name: Wanda Fuller  Patient MRN:   [id number]  Referring provider: Dr. Hester Mates  Reason for Consult: PE in pregnancy   HPI: Wanda Fuller is a 36 y.o. G2P0010 at [redacted]w[redacted]d  admitted for a PE in pregnancy.   She was seen in the office yesterday for a biophysical profile and felt relatively fine except for chronic cough.  She reports that she went home and the cough was persistent and worsening so she went to the emergency room where she was diagnosed with the pulmonary embolism.  She has a history of a pulmonary embolism in 2017.  She had a workup that was negative except for lupus anticoagulant which was positive once but repeated and was negative.  During this time  she was on Xarelto managed by hematologist but then self discontinued due to cost.  These clinical notes are available in care everywhere.  Since admission the patient feels relatively fine.  She has minimal shortness of breath and chest pain.  She does have increased swelling in her legs has been there since the past few weeks.  She reports good fetal movement and denies vaginal bleeding or loss of fluid.  Past Obstetrical History:  OB History  Gravida Para Term Preterm AB Living  2       1 0  SAB IAB Ectopic Multiple Live Births    1     0    # Outcome Date GA Lbr Len/2nd Weight Sex Delivery Anes PTL Lv  2 Current           1 IAB              Past Gynecologic History:  Not discussed  Past Medical History:  Past Medical History:  Diagnosis Date   Asthma    HSV-1 infection    Pulmonary embolism (HCC)       Past Surgical History:    Past Surgical History:  Procedure Laterality Date   NO PAST SURGERIES      Family History:   family history includes Cancer in her maternal aunt, paternal grandmother, and paternal uncle; Hypertension in her mother.   Social History:   Social History   Socioeconomic History   Marital status: Single    Spouse name: Not on file   Number of  children: Not on file   Years of education: Not on file   Highest education level: Not on file  Occupational History   Not on file  Tobacco Use   Smoking status: Former   Smokeless tobacco: Never  Vaping Use   Vaping Use: Never used  Substance and Sexual Activity   Alcohol use: Not Currently   Drug use: No   Sexual activity: Yes    Birth control/protection: None  Other Topics Concern   Not on file  Social History Narrative   Not on file   Social Determinants of Health   Financial Resource Strain: Not on file  Food Insecurity: No Food Insecurity (09/17/2022)   Hunger Vital Sign    Worried About Running Out of Food in the Last Year: Never true    Ran Out of Food in the Last Year: Never true  Transportation Needs: No Transportation Needs (09/17/2022)   PRAPARE - Administrator, Civil Service (Medical): No    Lack of Transportation (Non-Medical): No  Physical Activity: Not on file  Stress: Not on file  Social Connections: Not on file  Intimate Partner Violence: Not At Risk (09/17/2022)  Humiliation, Afraid, Rape, and Kick questionnaire    Fear of Current or Ex-Partner: No    Emotionally Abused: No    Physically Abused: No    Sexually Abused: No    Home Medications:   No current facility-administered medications on file prior to encounter.   Current Outpatient Medications on File Prior to Encounter  Medication Sig Dispense Refill   albuterol (PROVENTIL HFA;VENTOLIN HFA) 108 (90 Base) MCG/ACT inhaler Inhale 2 puffs into the lungs every 6 (six) hours as needed for wheezing or shortness of breath.     Prenat-Fe Carbonyl-FA-Omega 3 (ONE-A-DAY WOMENS PRENATAL 1 PO) Take by mouth.     Doxylamine-Pyridoxine (DICLEGIS PO) Take by mouth. (Patient not taking: Reported on 09/16/2022)     ferrous sulfate 325 (65 FE) MG tablet Take 325 mg by mouth daily with breakfast.     glycopyrrolate (ROBINUL) 1 MG tablet Take 1 tablet (1 mg total) by mouth every 8 (eight) hours as  needed (spitting). (Patient not taking: Reported on 03/31/2022) 30 tablet 1   pantoprazole (PROTONIX) 20 MG tablet Take 1 tablet (20 mg total) by mouth daily. (Patient not taking: Reported on 03/31/2022) 30 tablet 0      Allergies:   No Known Allergies   Physical Exam:   Today's Vitals   09/17/22 0325 09/17/22 0742 09/17/22 0814 09/17/22 1204  BP: 130/73 119/71  134/73  Pulse: (!) 132 (!) 109  (!) 110  Resp: 18 20  (!) 21  Temp: 99.4 F (37.4 C) 98 F (36.7 C)  98.5 F (36.9 C)  TempSrc: Oral Oral  Oral  SpO2: 98% 100%  100%  Weight:      Height:      PainSc:   0-No pain    Body mass index is 57.18 kg/m. Sitting comfortably in bed Nonlabored breathing Normal rate and rhythm Abdomen is nontender  Assessment  [name] is a 36 y.o. G2P0010 at [redacted]w[redacted]d admitted for a pulmonary embolus  1. PE in pregnancy  -I counseled the patient about pulmonary embolism in pregnancy including the risks and clinical implications. -This is her second PE in her lifetime. Her first was in 2017, thrombophilia workup was negative except for a single lupus anticoagulant positive laboratory value.  At that time she was placed on Xarelto at [facility] hematology.  The last note I can see was in 2018. -Continue full dose anticoagulation with Lovenox 1 mg/kg every 12 hours.  Be mindful of weight fluctuations and need to adjust dosing. -Follow antitXa levels with a goal of 0.6-1.  This should be drawn 4 hours after her dose of Lovenox. -Establish care with hematology for long-term follow-up and to determine how long she should be on anticoagulation -Lower extremity Dopplers were negative -Echo is pending -Duration of anticoagulation to be determined by hematology but will likely be at least 6 to 12 weeks postpartum. -Encouraged ambulation as tolerated  -Betamethasone course for fetal lung maturity -Currently on telemetry but can likely discontinue as long as her echo is normal and her symptoms are  improving with normal vital signs. -She may possibly be able to be discharged in the next few days as long as she clinically improves and the rest of her imaging is unremarkable as long as hematology is okay with this.  2.  Elevated blood pressure -Possibly gestational hypertension, labs have been reassuring (mild transaminitis) patient is asymptomatic from a preeclampsia point of view.  She has only had a few elevated blood pressures and her protein to  creatinine ratio was normal therefore I do not think she has preeclampsia but we still need to monitor. -Repeat labs at least daily for now given her transaminitis.  Also consider repeat labs if her blood pressure spikes. -If discharged she will need serial growth ultrasounds every 4 weeks, weekly antenatal testing and delivery around 37 weeks or sooner if indicated.  3.  Anemia, normocytic, etiology unknown but likely pregnancy related and iron deficient -In 2017 she had normal hemoglobin values.  Currently there is no obvious source of bleeding -Recommend an iron panel including a ferritin, total iron binding capacity, percent saturation -If she is iron deficient consider an IV iron infusion -Type and cross as necessary for transfusion  4. Pregnancy care -Continue routine obstetric care while inpatient -NST 2-3 times a day or sooner if indicated.  Thank you for the opportunity to be involved with this patient's care. Please let us know if we can be of any further assistance.  I spent a total of 65 minutes in rec to patient care as well as chart review.  Braxton Feathers  MFM, [place]   CODE: [id number]  09/17/2022  12:02 PM

## 2022-09-17 NOTE — Progress Notes (Addendum)
Wanda Fuller is a 36 y.o. G2P0010 at 60w6dadmitted for Pulmonary embolism during puerperium [O88.23]. She has had a cough for a week and it has become worse.  Non productive no wheezing.  Pt has H/O of PE in 2017. She had a full work up that was negative.     Subjective: Patient doing well resting in bed after just returning from getting echo and LE doppler. LE doppler negative and echocardiogram normal. Discussed she meets criteria for GHTN, PIH labs wnl. Mild transaminitis, but not twice upper limit of normal, will continue to trend. Anticipate delivery at 33wga. Reviewed patient's 1hr GTT from office which was elevated at 152, with normal 3hr GTT. CBG during admission elevated since receiving BMZ, will continue to follow with fasting and 2hr postprandial and cover with SSI. Patient is on lovenox '1mg'$ /kg BID and will check antitXa levels with a goal of 0.6-1 after 3rd dose per MFM recommendations. Patient to continue anticoagulation through pregnancy and postpartum period. Will consult hematology for recommendation after postpartum period. Also discussed anemia of 8.3. Will check iron studies and administer iron transfusions.   Objective: BP 119/71 (BP Location: Left Arm)   Pulse (!) 109   Temp 98 F (36.7 C) (Oral)   Resp 20   Ht '5\' 2"'$  (1.575 m)   Wt (!) 141.8 kg   LMP 01/26/2022   SpO2 100%   BMI 57.18 kg/m  I/O last 3 completed shifts: In: 398.9 [P.O.:240; I.V.:158.9] Out: -  No intake/output data recorded.  FHT:  FHR: 125 bpm, variability: moderate,  accelerations:  Present,  decelerations:  Absent UC:   irregular, every 5-10 minutes, patient occasionally feeling mild back cramps SVE:   Dilation: Closed Effacement (%): Thick Exam by:: NSophronia Simas MD  Labs: Lab Results  Component Value Date   WBC 5.9 09/17/2022   HGB 8.3 (L) 09/17/2022   HCT 25.6 (L) 09/17/2022   MCV 82.6 09/17/2022   PLT 198 09/17/2022   CT angio 09/17/2022: Impression 1. Bilateral segmental and  subsegmental pulmonary emboli and left lower lobe lobar pulmonary emboli with findings of right heart strain. 2. Mild cardiomegaly. 3. Small hiatal hernia. 4. Hepatic hemangioma, increased in size from 2017.  Assessment / Plan: 36y.o. G2P0010 at 361w6dBZM 12/22 (0100), next dose due12/23 (0100) -MFM consult, recommendations appreciated -MFM [place]/21: ceph, post, AFI 18, 8/8, 12/14 5.8 lbs (93%)  -NST qshift  PE -LE doppler negative -Lovenox '1mg'$ /kg BID -Follow antitXa levels with a goal of 0.6-1 per MFM recs  GHTN -Anticipate delivery at 37wga -Continue to monitor BP -PIH labs wnl -Mild transaminitis, continue to trend  HVS-1 -Valtrex '500mg'$  BID for suppression  Anemia -Hematology consult pending  -IV iron transfusion  Cardiomegaly  -Noted on CT -Echocardiogram wnl, mild enlargement of right ventricle w/ normal function no evidence of significant RV strain, EF 65-70%  Elevated blood glucose -Failed 1hr GTT, passed 3hr GTT, CBG likely elevated d/t BMZ with cover with SSI  AMA  Obesity -BMI 57  EbJosefine ClassMD 09/17/2022, 12:27 PM

## 2022-09-18 LAB — GLUCOSE, CAPILLARY
Glucose-Capillary: 129 mg/dL — ABNORMAL HIGH (ref 70–99)
Glucose-Capillary: 123 mg/dL — ABNORMAL HIGH (ref 70–99)
Glucose-Capillary: 109 mg/dL — ABNORMAL HIGH (ref 70–99)
Glucose-Capillary: 106 mg/dL — ABNORMAL HIGH (ref 70–99)

## 2022-09-18 LAB — COMPREHENSIVE METABOLIC PANEL
ALT: 63 U/L — ABNORMAL HIGH (ref 0–44)
AST: 39 U/L (ref 15–41)
Albumin: 2.3 g/dL — ABNORMAL LOW (ref 3.5–5.0)
Alkaline Phosphatase: 98 U/L (ref 38–126)
Anion gap: 8 (ref 5–15)
BUN: 5 mg/dL — ABNORMAL LOW (ref 6–20)
CO2: 19 mmol/L — ABNORMAL LOW (ref 22–32)
Calcium: 8.9 mg/dL (ref 8.9–10.3)
Chloride: 107 mmol/L (ref 98–111)
Creatinine, Ser: 0.53 mg/dL (ref 0.44–1.00)
GFR, Estimated: >60 mL/min (ref >60)
Glucose, Bld: 121 mg/dL — ABNORMAL HIGH (ref 70–99)
Potassium: 3.7 mmol/L (ref 3.5–5.1)
Sodium: 134 mmol/L — ABNORMAL LOW (ref 135–145)
Total Bilirubin: 0.5 mg/dL (ref 0.3–1.2)
Total Protein: 5.8 g/dL — ABNORMAL LOW (ref 6.5–8.1)

## 2022-09-18 LAB — CBC
HCT: 27.3 % — ABNORMAL LOW (ref 36.0–46.0)
Hemoglobin: 8.5 g/dL — ABNORMAL LOW (ref 12.0–15.0)
MCH: 26.7 pg (ref 26.0–34.0)
MCHC: 31.1 g/dL (ref 30.0–36.0)
MCV: 85.8 fL (ref 80.0–100.0)
Platelets: 228 10*3/uL (ref 150–400)
RBC: 3.18 MIL/uL — ABNORMAL LOW (ref 3.87–5.11)
RDW: 15.2 % (ref 11.5–15.5)
WBC: 5.2 10*3/uL (ref 4.0–10.5)
nRBC: 0.8 % — ABNORMAL HIGH (ref 0.0–0.2)

## 2022-09-18 LAB — HEPARIN ANTI-XA: Heparin LMW: 1.16 IU/mL

## 2022-09-18 MED ORDER — ENOXAPARIN (LOVENOX) PATIENT EDUCATION KIT
PACK | Freq: Once | Status: AC
  Filled 2022-09-18: qty 1

## 2022-09-18 NOTE — Progress Notes (Signed)
Lovenox Instructions and demonstration given to patient and significant other.  Plan for significant other to give next scheduled dose. Patient and significant other states understanding of procedure.

## 2022-09-18 NOTE — Progress Notes (Signed)
Patient ID: Barbaraann Cao, female   DOB: 1985/11/02, 36 y.o.   MRN: [id number]   Subjective. Pt reports continued cough. Some sob after coughing but feels better than when she came in. She expressed that she would not be able to give herself the lovenox shots once she is discharged. She would like her partner to be trained to give the lovenox injections.  +FM no lof no contractions.   Vitals:   09/18/22 0333 09/18/22 0817 09/18/22 1141 09/18/22 1628  BP: (!) 145/79 (!) 143/80 127/65 137/69  Pulse: (!) 104 (!) 103 (!) 112 (!) 115  Resp: '18 17 17 18  '$ Temp: 98.4 F (36.9 C) 97.8 F (36.6 C) 98.4 F (36.9 C) 98.4 F (36.9 C)  TempSrc: Oral Oral Oral Oral  SpO2: 97% 99% 99% 99%  Weight:      Height:        General Alert and oriented no acute distress.  Resp. No distress noted.  Abdomen Gravid nontender  Ext. No evidence of DVT  FHR NST this AM  Baseline 120's moderate availability. Accelerations present no decelerations.  Toco: no contractions noted.   Results for orders placed or performed during the hospital encounter of 09/16/22 (from the past 24 hour(s))  Glucose, capillary     Status: Abnormal   Collection Time: 09/17/22  9:05 PM  Result Value Ref Range   Glucose-Capillary 130 (H) 70 - 99 mg/dL  CBC     Status: Abnormal   Collection Time: 09/18/22  4:13 AM  Result Value Ref Range   WBC 5.2 4.0 - 10.5 K/uL   RBC 3.18 (L) 3.87 - 5.11 MIL/uL   Hemoglobin 8.5 (L) 12.0 - 15.0 g/dL   HCT 27.3 (L) 36.0 - 46.0 %   MCV 85.8 80.0 - 100.0 fL   MCH 26.7 26.0 - 34.0 pg   MCHC 31.1 30.0 - 36.0 g/dL   RDW 15.2 11.5 - 15.5 %   Platelets 228 150 - 400 K/uL   nRBC 0.8 (H) 0.0 - 0.2 %  Comprehensive metabolic panel     Status: Abnormal   Collection Time: 09/18/22  4:13 AM  Result Value Ref Range   Sodium 134 (L) 135 - 145 mmol/L   Potassium 3.7 3.5 - 5.1 mmol/L   Chloride 107 98 - 111 mmol/L   CO2 19 (L) 22 - 32 mmol/L   Glucose, Bld 121 (H) 70 - 99 mg/dL   BUN 5 (L) 6 - 20  mg/dL   Creatinine, Ser 0.53 0.44 - 1.00 mg/dL   Calcium 8.9 8.9 - 10.3 mg/dL   Total Protein 5.8 (L) 6.5 - 8.1 g/dL   Albumin 2.3 (L) 3.5 - 5.0 g/dL   AST 39 15 - 41 U/L   ALT 63 (H) 0 - 44 U/L   Alkaline Phosphatase 98 38 - 126 U/L   Total Bilirubin 0.5 0.3 - 1.2 mg/dL   GFR, Estimated >60 >60 mL/min   Anion gap 8 5 - 15  Low molecular wgt heparin (fractionated) (hepr anti-XA)     Status: None   Collection Time: 09/18/22  4:13 AM  Result Value Ref Range   Heparin LMW 1.16 IU/mL  Glucose, capillary     Status: Abnormal   Collection Time: 09/18/22  5:24 AM  Result Value Ref Range   Glucose-Capillary 129 (H) 70 - 99 mg/dL  Glucose, capillary     Status: Abnormal   Collection Time: 09/18/22 11:29 AM  Result Value Ref Range   Glucose-Capillary  123 (H) 70 - 99 mg/dL  Glucose, capillary     Status: Abnormal   Collection Time: 09/18/22  5:49 PM  Result Value Ref Range   Glucose-Capillary 109 (H) 70 - 99 mg/dL    Assessment / Plan: 35 y.o. G2P0010 at [redacted]w[redacted]d-BZM 12/22 (0100), next dose due12/23 (0100) -MFM consult, recommendations appreciated -MFM [place]12/21: ceph, post, AFI 18, 8/8, 12/14 5.8 lbs (93%)  -NST qshift   PE -LE doppler negative -Lovenox '1mg'$ /kg BID -Follow antitXa levels with a goal of 0.6-1 per MFM recs -Partner to be taught to give Lovenox injections to prepare for administration once discharged.   GHTN -Anticipate delivery at 37wga -Continue to monitor BP -PIH labs wnl -Mild transaminitis, continue to trend   HVS-1 -Valtrex '500mg'$  BID for suppression   Anemia -Hematology consult pending  -IV iron transfusion   Cardiomegaly  -Noted on CT -Echocardiogram wnl, mild enlargement of right ventricle w/ normal function no evidence of significant RV strain, EF 65-70% - given normal Echo. Telemetry was discontinued.   Elevated blood glucose -Failed 1hr GTT, passed 3hr GTT, CBG likely elevated d/t BMZ with cover with SSI   AMA   Obesity -BMI 57

## 2022-09-19 LAB — COMPREHENSIVE METABOLIC PANEL
ALT: 56 U/L — ABNORMAL HIGH (ref 0–44)
AST: 45 U/L — ABNORMAL HIGH (ref 15–41)
Albumin: 2.4 g/dL — ABNORMAL LOW (ref 3.5–5.0)
Alkaline Phosphatase: 88 U/L (ref 38–126)
Anion gap: 12 (ref 5–15)
BUN: <5 mg/dL — ABNORMAL LOW (ref 6–20)
CO2: 17 mmol/L — ABNORMAL LOW (ref 22–32)
Calcium: 9.3 mg/dL (ref 8.9–10.3)
Chloride: 107 mmol/L (ref 98–111)
Creatinine, Ser: 0.6 mg/dL (ref 0.44–1.00)
GFR, Estimated: >60 mL/min (ref >60)
Glucose, Bld: 102 mg/dL — ABNORMAL HIGH (ref 70–99)
Potassium: 3.9 mmol/L (ref 3.5–5.1)
Sodium: 136 mmol/L (ref 135–145)
Total Bilirubin: 0.7 mg/dL (ref 0.3–1.2)
Total Protein: 5.6 g/dL — ABNORMAL LOW (ref 6.5–8.1)

## 2022-09-19 LAB — CBC
HCT: 26.9 % — ABNORMAL LOW (ref 36.0–46.0)
Hemoglobin: 8.3 g/dL — ABNORMAL LOW (ref 12.0–15.0)
MCH: 26.5 pg (ref 26.0–34.0)
MCHC: 30.9 g/dL (ref 30.0–36.0)
MCV: 85.9 fL (ref 80.0–100.0)
Platelets: 233 10*3/uL (ref 150–400)
RBC: 3.13 MIL/uL — ABNORMAL LOW (ref 3.87–5.11)
RDW: 15.6 % — ABNORMAL HIGH (ref 11.5–15.5)
WBC: 6.5 10*3/uL (ref 4.0–10.5)
nRBC: 1.9 % — ABNORMAL HIGH (ref 0.0–0.2)

## 2022-09-19 LAB — CULTURE, BETA STREP (GROUP B ONLY)

## 2022-09-19 LAB — GLUCOSE, CAPILLARY
Glucose-Capillary: 103 mg/dL — ABNORMAL HIGH (ref 70–99)
Glucose-Capillary: 95 mg/dL (ref 70–99)
Glucose-Capillary: 103 mg/dL — ABNORMAL HIGH (ref 70–99)
Glucose-Capillary: 94 mg/dL (ref 70–99)

## 2022-09-19 LAB — HEPARIN ANTI-XA: Heparin LMW: 1.05 IU/mL

## 2022-09-19 MED ORDER — VALACYCLOVIR HCL 500 MG PO TABS: 500.0000 mg | ORAL_TABLET | Freq: Two times a day (BID) | ORAL | 2 refills | Status: DC

## 2022-09-19 MED ORDER — LABETALOL HCL 200 MG PO TABS: 200.0000 mg | ORAL_TABLET | Freq: Two times a day (BID) | ORAL | 2 refills | Status: DC

## 2022-09-19 MED ORDER — LABETALOL HCL 200 MG PO TABS
200.0000 mg | ORAL_TABLET | Freq: Two times a day (BID) | ORAL | Status: DC
  Administered 2022-09-19 – 2022-09-20 (×3): 200 mg via ORAL
  Filled 2022-09-19 (×3): qty 1

## 2022-09-19 MED ORDER — DOCUSATE SODIUM 100 MG PO CAPS: 100.0000 mg | ORAL_CAPSULE | Freq: Two times a day (BID) | ORAL | 0 refills | Status: DC

## 2022-09-19 MED ORDER — ENOXAPARIN SODIUM 150 MG/ML IJ SOSY: 150.0000 mg | PREFILLED_SYRINGE | Freq: Two times a day (BID) | INTRAMUSCULAR | 0 refills | Status: DC

## 2022-09-19 MED ORDER — INSULIN ASPART 100 UNIT/ML IJ SOLN: 0.0000 [IU] | Freq: Three times a day (TID) | INTRAMUSCULAR | Status: DC

## 2022-09-19 NOTE — Progress Notes (Addendum)
Brier Lewing is a 36 y.o. G2P0010 at 77w1dadmitted for Pulmonary embolism during puerperium [O88.23]. She has had a cough for a week became worse.  Non productive no wheezing.  Pt has H/O of PE in 2017. She had a full work up that was negative.     Subjective: Patient doing well resting in bed. She had some coughing last night accompanied by mild chest discomfort with coughing episode. Patient on telemetry. Denies SOB, chest pain, wheezing. Patient and partner s/p levonox administration education. AntiXa wnl at 1.16, however lab drawn 1hr early. Discussed with pharmacy and level is therapeutic and although on the higher limit of normal would like be more accurate if drawn 4-6 hours after administration. Will adjust dosing time to 1200 and 0000 vs. 1400 and 0200 and repeat antiXa today at 1700 (4-6 hours after lovenox dose). FHT reviewed reactive. Patient reports good fetal movement and denies contractions. BP mild range elevated at 130-140s/70s-80s. Denies HA, CP, SOB, RUQ pain, worsening upper extremity edema, changes in vision.   Objective: BP (!) 143/84 (BP Location: Left Arm)   Pulse (!) 105   Temp 98.4 F (36.9 C) (Oral)   Resp 18   Ht '5\' 2"'$  (1.575 m)   Wt (!) 141.8 kg   LMP 01/26/2022   SpO2 97%   BMI 57.18 kg/m  I/O last 3 completed shifts: In: 2572.6 [I.V.:2297.6; IV Piggyback:275] Out: 500 [Urine:500] No intake/output data recorded.  General Alert and oriented no acute distress.  Resp. No distress noted.  Abdomen Gravid nontender  Ext. No evidence of DVT   FHT 12/24 @ 1000:  FHR: 140 bpm, variability: moderate,  accelerations:  Present,  decelerations:  Absent, reactive UC:   none SVE:   Dilation: Closed Effacement (%): Thick Exam by:: NSophronia Simas MD  Labs: Lab Results  Component Value Date   WBC 6.5 09/19/2022   HGB 8.3 (L) 09/19/2022   HCT 26.9 (L) 09/19/2022   MCV 85.9 09/19/2022   PLT 233 09/19/2022   CT angio 09/17/2022: Impression 1. Bilateral  segmental and subsegmental pulmonary emboli and left lower lobe lobar pulmonary emboli with findings of right heart strain. 2. Mild cardiomegaly. 3. Small hiatal hernia. 4. Hepatic hemangioma, increased in size from 2017.  Assessment / Plan: 36y.o. G2P0010 at 34w1dS/p BZM 12/22-12/23  -S/p MFM consult -MFM [place]282/95cephalic, posterior placenta, AFI: 18, BPP: 8/8, 12/14 EFW: 5.8 lbs (93%)  -NST qshift  PE -LE doppler negative -Lovenox '1mg'$ /kg BID, rounded to 150 mg BID for ease of administration for patient -Follow antiXa levels with a goal of 0.6-1 per MFM recs  GHTN -Anticipate delivery at 37wga -Continue to monitor BP -PIH labs wnl -Mild transaminitis, continue to trend -Labetalol '200mg'$  BID  HVS-1 -Valtrex '500mg'$  BID for suppression  Anemia -Hematology consult outpatient for PP anticoagulation recommendations  -S/p IV iron transfusion  Cardiomegaly  -Noted on CT -Echocardiogram wnl, mild enlargement of right ventricle w/ normal function no evidence of significant RV strain, EF 65-70%  Elevated blood glucose -Failed 1hr GTT, passed 3hr GTT, CBG likely elevated d/t BMZ with cover with SSI  AMA  Obesity -BMI 57   Anticipate d/c tomorrow if BP controlled, not experiencing SOB, and Lovenox at appropriate therapeutic dose pending antiXa level. Discussed with MFM.  EbJosefine ClassMD 09/19/2022, 8:09 AM

## 2022-09-20 ENCOUNTER — Inpatient Hospital Stay (HOSPITAL_BASED_OUTPATIENT_CLINIC_OR_DEPARTMENT_OTHER): Payer: BC Managed Care – PPO

## 2022-09-20 DIAGNOSIS — Z3A34 34 weeks gestation of pregnancy: Secondary | ICD-10-CM

## 2022-09-20 DIAGNOSIS — O133 Gestational [pregnancy-induced] hypertension without significant proteinuria, third trimester: Secondary | ICD-10-CM

## 2022-09-20 DIAGNOSIS — E669 Obesity, unspecified: Secondary | ICD-10-CM

## 2022-09-20 DIAGNOSIS — O09523 Supervision of elderly multigravida, third trimester: Secondary | ICD-10-CM | POA: Diagnosis not present

## 2022-09-20 DIAGNOSIS — Z87891 Personal history of nicotine dependence: Secondary | ICD-10-CM

## 2022-09-20 DIAGNOSIS — O99213 Obesity complicating pregnancy, third trimester: Secondary | ICD-10-CM

## 2022-09-20 DIAGNOSIS — O99891 Other specified diseases and conditions complicating pregnancy: Secondary | ICD-10-CM

## 2022-09-20 DIAGNOSIS — Z86711 Personal history of pulmonary embolism: Secondary | ICD-10-CM | POA: Diagnosis not present

## 2022-09-20 DIAGNOSIS — J45909 Unspecified asthma, uncomplicated: Secondary | ICD-10-CM

## 2022-09-20 DIAGNOSIS — O99513 Diseases of the respiratory system complicating pregnancy, third trimester: Secondary | ICD-10-CM

## 2022-09-20 DIAGNOSIS — O88213 Thromboembolism in pregnancy, third trimester: Secondary | ICD-10-CM | POA: Diagnosis not present

## 2022-09-20 LAB — TYPE AND SCREEN
ABO/RH(D): A POS
Antibody Screen: NEGATIVE

## 2022-09-20 LAB — CBC
HCT: 26 % — ABNORMAL LOW (ref 36.0–46.0)
Hemoglobin: 8.2 g/dL — ABNORMAL LOW (ref 12.0–15.0)
MCH: 27 pg (ref 26.0–34.0)
MCHC: 31.5 g/dL (ref 30.0–36.0)
MCV: 85.5 fL (ref 80.0–100.0)
Platelets: 229 10*3/uL (ref 150–400)
RBC: 3.04 MIL/uL — ABNORMAL LOW (ref 3.87–5.11)
RDW: 15.7 % — ABNORMAL HIGH (ref 11.5–15.5)
WBC: 6.2 10*3/uL (ref 4.0–10.5)
nRBC: 2.9 % — ABNORMAL HIGH (ref 0.0–0.2)

## 2022-09-20 LAB — GLUCOSE, CAPILLARY
Glucose-Capillary: 89 mg/dL (ref 70–99)
Glucose-Capillary: 87 mg/dL (ref 70–99)

## 2022-09-20 MED ORDER — VALACYCLOVIR HCL 500 MG PO TABS: 500.0000 mg | ORAL_TABLET | Freq: Two times a day (BID) | ORAL | 2 refills | Status: DC

## 2022-09-20 MED ORDER — ENOXAPARIN SODIUM 150 MG/ML IJ SOSY: 150.0000 mg | PREFILLED_SYRINGE | Freq: Two times a day (BID) | INTRAMUSCULAR | 0 refills | Status: DC

## 2022-09-20 MED ORDER — LABETALOL HCL 200 MG PO TABS: 200.0000 mg | ORAL_TABLET | Freq: Two times a day (BID) | ORAL | 2 refills | Status: AC

## 2022-09-20 MED ORDER — DOCUSATE SODIUM 100 MG PO CAPS: 100.0000 mg | ORAL_CAPSULE | Freq: Two times a day (BID) | ORAL | 0 refills | Status: AC

## 2022-09-20 NOTE — Progress Notes (Signed)
Wanda Fuller is a 36 y.o. G2P0010 at 24w2dadmitted for Pulmonary embolism during puerperium [O88.23]. She has had a cough for a week became worse.  Non productive no wheezing.  Pt has H/O of PE in 2017. She had a full work up that was negative.     Subjective: Patient doing well resting in bed. She had some coughing last night with no chest pain. She does have occasional periods of SOB with coughing episode that resolved immediately after coughing. BP wnl overnight on Labetalol 200 mg BID. Denies HA, CP, SOB, RUQ pain, worsening upper extremity edema, changes in vision. AntiXa wnl and patient to continue Lovenox 150 mg BID until 24hr prior to scheduled IOL at 37 weeks. FHT reviewed reactive. Patient reports good fetal movement and denies contractions.   Objective: BP 134/69 (BP Location: Left Arm)   Pulse 100   Temp 98.2 F (36.8 C) (Oral)   Resp 18   Ht '5\' 2"'$  (1.575 m)   Wt (!) 141.8 kg   LMP 01/26/2022   SpO2 97%   BMI 57.18 kg/m  I/O last 3 completed shifts: In: 1276.1 [I.V.:1276.1] Out: 500 [Urine:500] No intake/output data recorded.  General Alert and oriented no acute distress.  Resp. No distress noted.  Abdomen Gravid nontender  Ext. No evidence of DVT   FHT 12/24 @ 2200:  FHR: 140 bpm, variability: moderate,  accelerations:  Present,  decelerations:  Absent, reactive UC:   none SVE:   Dilation: Closed Effacement (%): Thick Exam by:: NSophronia Simas MD  Labs: Lab Results  Component Value Date   WBC 6.2 09/20/2022   HGB 8.2 (L) 09/20/2022   HCT 26.0 (L) 09/20/2022   MCV 85.5 09/20/2022   PLT 229 09/20/2022   CT angio 09/17/2022: Impression 1. Bilateral segmental and subsegmental pulmonary emboli and left lower lobe lobar pulmonary emboli with findings of right heart strain. 2. Mild cardiomegaly. 3. Small hiatal hernia. 4. Hepatic hemangioma, increased in size from 2017.  Assessment / Plan: 36y.o. G2P0010 at 337w2dS/p BZM 12/22-12/23  -S/p MFM  consult -MFM [place]221/30cephalic, posterior placenta, AFI: 18, BPP: 8/8, 12/14 EFW: 5.8 lbs (93%)  -NST qshift  PE -LE doppler negative -Lovenox '1mg'$ /kg BID, rounded to 150 mg BID for ease of administration for patient -Follow antiXa levels with a goal of 0.6-1 per MFM recs  GHTN -Anticipate delivery at 37wga -Continue to monitor BP -PIH labs wnl -Mild transaminitis, continue to trend (stable) -Labetalol '200mg'$  BID started 09/19/2022  HVS-1 -Valtrex '500mg'$  BID for suppression  Anemia -Hematology consult outpatient for PP anticoagulation recommendations  -S/p IV iron transfusion  Cardiomegaly  -Noted on CT -Echocardiogram wnl, mild enlargement of right ventricle w/ normal function no evidence of significant RV strain, EF 65-70%  Elevated blood glucose -Failed 1hr GTT, passed 3hr GTT, CBG likely elevated d/t BMZ with cover with SSI  AMA  Obesity -BMI 57   Discharge to home today after MFM BPP.   EbJosefine ClassMD 09/20/2022, 5:50 AM

## 2022-09-20 NOTE — Procedures (Signed)
Pt given discharge instructions. Pt denies any questions or concerns. Pt discharged home with significant other. Pt ambulatory off unit at this time.

## 2022-09-20 NOTE — Discharge Summary (Addendum)
Obstetric Discharge Summary Reason for Admission: Pulmonary Embolism Prenatal Procedures: CT angio, ultrasound  Hemoglobin  Date Value Ref Range Status  09/20/2022 8.2 (L) 12.0 - 15.0 g/dL Final   HCT  Date Value Ref Range Status  09/20/2022 26.0 (L) 36.0 - 46.0 % Final    Physical Exam:  General Alert and oriented no acute distress.  Resp. No distress noted.  Abdomen Gravid nontender  Ext. No evidence of DVT    FHT 12/24 @ 2200:  FHR: 140 bpm, variability: moderate,  accelerations:  Present,  decelerations:  Absent, reactive UC:   none  Discharge Diagnoses: Pulmonary embolism effecting 3rd trimeter of pregnancy, Gestational hypertension  Discharge Information: Date: 09/20/2022 Activity: unrestricted Diet: routine Medications:  Lovenox '150mg'$  BID, Labetalol 200 mg BID Condition: stable Instructions: refer to practice specific booklet Discharge to: home  [facility]. Schedule an appointment as soon as possible for a visit in 1 week(s).   Specialty: Obstetrics and Gynecology Why: F/u and schedule IOL for 37 weeks Contact information: [address] [place] [address] [phone]        Heath Lark, MD. Schedule an appointment as soon as possible for a visit in 1 week(s).   Specialty: Hematology and Oncology Why: Establish care for pulmonary embolism in pregnancy for anticoagulation after postpartum period. Contact information: [place] [address] [phone]         Valeda Malm, DO. Schedule an appointment as soon as possible for a visit in 1 week(s).   Specialty: Obstetrics and Gynecology Contact information: [place] [address] [phone]                -Continue Lovenox 150 mg BID until 24hr prior to scheduled IOL at 37 weeks.  -Discussed diagnoses of GHTN and s/sx pre-eclampsia. Patient to continue  Labetalol 200 mg BID. -Discharge to home today after MFM BP today 09/20/2022.   Josefine Class 09/20/2022, 7:29 AM

## 2022-09-23 ENCOUNTER — Ambulatory Visit: Payer: BC Managed Care – PPO

## 2022-09-26 ENCOUNTER — Encounter (HOSPITAL_COMMUNITY): Payer: Self-pay | Admitting: Obstetrics & Gynecology

## 2022-09-26 ENCOUNTER — Inpatient Hospital Stay (HOSPITAL_COMMUNITY)
Admission: AD | Admit: 2022-09-26 | Discharge: 2022-09-26 | Disposition: A | Discharge status: Home / Self Care | Payer: BC Managed Care – PPO | Attending: Obstetrics & Gynecology | Admitting: Obstetrics & Gynecology

## 2022-09-26 DIAGNOSIS — O1203 Gestational edema, third trimester: Secondary | ICD-10-CM | POA: Diagnosis not present

## 2022-09-26 DIAGNOSIS — R059 Cough, unspecified: Secondary | ICD-10-CM | POA: Insufficient documentation

## 2022-09-26 DIAGNOSIS — O26893 Other specified pregnancy related conditions, third trimester: Secondary | ICD-10-CM | POA: Diagnosis present

## 2022-09-26 DIAGNOSIS — Z86711 Personal history of pulmonary embolism: Secondary | ICD-10-CM | POA: Insufficient documentation

## 2022-09-26 DIAGNOSIS — O133 Gestational [pregnancy-induced] hypertension without significant proteinuria, third trimester: Secondary | ICD-10-CM | POA: Insufficient documentation

## 2022-09-26 DIAGNOSIS — Z3A35 35 weeks gestation of pregnancy: Secondary | ICD-10-CM | POA: Insufficient documentation

## 2022-09-26 DIAGNOSIS — Z79899 Other long term (current) drug therapy: Secondary | ICD-10-CM | POA: Insufficient documentation

## 2022-09-26 NOTE — MAU Note (Signed)
.  Wanda Fuller is a 36 y.o. at 92w1dhere in MAU reporting: she had been admitted last week for blood clots in her lungs. She is taking Lovenox and labetalol. She reports that they told her to come back for worsening swelling in her legs, she has noticed the swelling getting worse since the day after christmas. She reports that the swelling did not go down after sleeping this morning. She reports no pain, but she feels "tight and full" in her legs. Earlier today she had tightness in her chest, but denies at this time. Denies pain, vaginal bleeding, LOF, or DFM.   LMP: N/A Onset of complaint: On-going Pain score: Denies Vitals:   09/26/22 2004  BP: 129/79  Pulse: 91  Resp: 20  Temp: 98.3 F (36.8 C)     FHT:129 Lab orders placed from triage:  UA

## 2022-09-26 NOTE — MAU Provider Note (Signed)
History     [id number]  Arrival date and time: 09/26/22 1943    Chief Complaint  Patient presents with   BLE swelling     HPI Wanda Fuller is a 36 y.o. at 18w1dwho presents for lower extremity swelling.  Patient hospitalized last week for bilateral pulmonary emboli & is currently on lovenox 150 mg BID. Also started on labetalol 200 BID for gestational hypertension. Has appointment with her OB on Tuesday.  Reports increase in bilateral lower extremity swelling since being discharged from the hospital last Monday. Has gradually worsened. Does not use compression socks. Has continued cough since her PE diagnosis that has gradually improved. Legs feel tight & uncomfortable but no calf pain.   OB History     Gravida  2   Para      Term      Preterm      AB  1   Living  0      SAB      IAB  1   Ectopic      Multiple      Live Births  0           Past Medical History:  Diagnosis Date   Asthma    HSV-1 infection    Pulmonary embolism (HCC)     Past Surgical History:  Procedure Laterality Date   NO PAST SURGERIES      Family History  Problem Relation Age of Onset   Hypertension Mother    Cancer Maternal Aunt    Cancer Paternal Uncle    Cancer Paternal Grandmother    Asthma Neg Hx    Diabetes Neg Hx    Heart disease Neg Hx    Stroke Neg Hx     No Known Allergies  No current facility-administered medications on file prior to encounter.   Current Outpatient Medications on File Prior to Encounter  Medication Sig Dispense Refill   docusate sodium (COLACE) 100 MG capsule Take 1 capsule (100 mg total) by mouth 2 (two) times daily for 10 days. 20 capsule 0   enoxaparin (LOVENOX) 150 MG/ML injection Inject 1 mL (150 mg total) into the skin every 12 (twelve) hours for 21 days. 42 mL 0   ferrous sulfate 325 (65 FE) MG tablet Take 325 mg by mouth daily with breakfast.     labetalol (NORMODYNE) 200 MG tablet Take 1 tablet (200 mg total) by mouth  2 (two) times daily. 60 tablet 2   Prenat-Fe Carbonyl-FA-Omega 3 (ONE-A-DAY WOMENS PRENATAL 1 PO) Take by mouth.     valACYclovir (VALTREX) 500 MG tablet Take 1 tablet (500 mg total) by mouth 2 (two) times daily. 60 tablet 2   albuterol (PROVENTIL HFA;VENTOLIN HFA) 108 (90 Base) MCG/ACT inhaler Inhale 2 puffs into the lungs every 6 (six) hours as needed for wheezing or shortness of breath.       ROS Pertinent positives and negative per HPI, all others reviewed and negative  Physical Exam   BP 133/77   Pulse 91   Temp 98.3 F (36.8 C) (Oral)   Resp 20   Ht '5\' 2"'$  (1.575 m)   Wt (!) 145 kg   LMP 01/26/2022   BMI 58.46 kg/m   Patient Vitals for the past 24 hrs:  BP Temp Temp src Pulse Resp Height Weight  09/26/22 2014 133/77 -- -- -- -- -- --  09/26/22 2004 129/79 98.3 F (36.8 C) Oral 91 20 '5\' 2"'$  (1.575 m) (!) 145 kg  Physical Exam Vitals and nursing note reviewed.  Constitutional:      General: She is not in acute distress.    Appearance: Normal appearance.  HENT:     Head: Normocephalic and atraumatic.  Cardiovascular:     Rate and Rhythm: Normal rate and regular rhythm.     Heart sounds: Normal heart sounds.     Comments: Calf diameter 55 cm bilaterally Pulmonary:     Effort: Pulmonary effort is normal. No respiratory distress.     Breath sounds: Normal breath sounds. No wheezing.  Musculoskeletal:     Right lower leg: 2+ Pitting Edema present.     Left lower leg: 2+ Pitting Edema present.  Neurological:     Mental Status: She is alert.  Psychiatric:        Mood and Affect: Mood normal.        Behavior: Behavior normal.     FHT Baseline 140, moderate variability, 15x15 accels, no decels Toco: UI Cat: 1  Labs No results found for this or any previous visit (from the past 24 hour(s)).  Imaging No results found.  MAU Course  Procedures Lab Orders  No laboratory test(s) ordered today   No orders of the defined types were placed in this  encounter.  Imaging Orders  No imaging studies ordered today    MDM Significant swelling of lower legs. Negative homans sign. Legs measure equally.  Lungs sound clear.  Is currently taking lovenox as prescribed for recent PE diagnosis.  Patient had negative doppler study last week   Consult with Dr. Elgie Congo. Recommends LE elevation at home.  Patient has f/u on Tuesday.  Assessment and Plan   1. Edema during pregnancy in third trimester   2. [redacted] weeks gestation of pregnancy    -LE elevation & frequent position changes -Compression socks -Reviewed reasons to return to [place], NP 09/26/22 8:57 PM

## 2022-09-27 NOTE — L&D Delivery Note (Addendum)
Operative Delivery Note Called at 12:46 with report that pt was dilating quickly and was 7-8cm with FHR in the 70s.  Sr. Damita Dunnings came and placed FSE.  UPon my arrival BP was 80s/50s, terb had been given along with fluid bolus and phenylephrine being given now. FHR was with minimal variability and having decels.  Pt found to be FD and +2 station and consented for vacuum delivery as she could not feel her ctxs and was not pushing well.  At 1:19 AM a viable female was delivered via Vaginal, Vacuum Neurosurgeon).  Presentation: vertex; Position: Left,, Occiput,, Anterior; Station: +2.  Verbal consent: obtained from patient.  Risks and benefits discussed in detail.  Risks include, but are not limited to the risks of anesthesia, bleeding, infection, damage to maternal tissues, fetal cephalhematoma.  There is also the risk of inability to effect vaginal delivery of the head, or shoulder dystocia that cannot be resolved by established maneuvers, leading to the need for emergency cesarean section.  APGAR: see neonatology notes ; weight  .   Placenta status: spont .   Cord:  3 V with the following complications: rapid progression and descent with tight nuchal cord x2 and immediately s/p bolus of epidural with low BPs.    Cord pH: pending Anesthesia:  epidural Instruments: Vacuum Episiotomy: None Lacerations: None Suture Repair:  n/a Est. Blood Loss (mL): 303  Mom to postpartum.  Baby to NICU.  Delice Lesch 10/11/2022, 1:37 AM

## 2022-09-30 ENCOUNTER — Ambulatory Visit: Payer: BC Managed Care – PPO | Attending: Obstetrics

## 2022-09-30 ENCOUNTER — Telehealth: Payer: Self-pay

## 2022-09-30 ENCOUNTER — Ambulatory Visit: Payer: BC Managed Care – PPO | Admitting: *Deleted

## 2022-09-30 VITALS — BP 119/64 | HR 97

## 2022-09-30 DIAGNOSIS — O99213 Obesity complicating pregnancy, third trimester: Secondary | ICD-10-CM | POA: Diagnosis present

## 2022-09-30 DIAGNOSIS — Z6841 Body Mass Index (BMI) 40.0 and over, adult: Secondary | ICD-10-CM | POA: Diagnosis present

## 2022-09-30 DIAGNOSIS — O88213 Thromboembolism in pregnancy, third trimester: Secondary | ICD-10-CM | POA: Diagnosis not present

## 2022-09-30 DIAGNOSIS — J45909 Unspecified asthma, uncomplicated: Secondary | ICD-10-CM

## 2022-09-30 DIAGNOSIS — O09523 Supervision of elderly multigravida, third trimester: Secondary | ICD-10-CM

## 2022-09-30 DIAGNOSIS — O133 Gestational [pregnancy-induced] hypertension without significant proteinuria, third trimester: Secondary | ICD-10-CM

## 2022-09-30 DIAGNOSIS — O4443 Low lying placenta NOS or without hemorrhage, third trimester: Secondary | ICD-10-CM

## 2022-09-30 DIAGNOSIS — E669 Obesity, unspecified: Secondary | ICD-10-CM | POA: Diagnosis not present

## 2022-09-30 DIAGNOSIS — O4440 Low lying placenta NOS or without hemorrhage, unspecified trimester: Secondary | ICD-10-CM | POA: Diagnosis present

## 2022-09-30 DIAGNOSIS — O99513 Diseases of the respiratory system complicating pregnancy, third trimester: Secondary | ICD-10-CM

## 2022-09-30 DIAGNOSIS — Z87891 Personal history of nicotine dependence: Secondary | ICD-10-CM

## 2022-09-30 DIAGNOSIS — O99891 Other specified diseases and conditions complicating pregnancy: Secondary | ICD-10-CM

## 2022-09-30 DIAGNOSIS — Z3A35 35 weeks gestation of pregnancy: Secondary | ICD-10-CM

## 2022-09-30 NOTE — Telephone Encounter (Signed)
Called patient to move her BPP to a 1:45pm arrival for a 2pm ultrasound this afternoon. Left message for patient to call me back.

## 2022-10-04 ENCOUNTER — Telehealth (HOSPITAL_COMMUNITY): Payer: Self-pay | Admitting: *Deleted

## 2022-10-04 NOTE — Telephone Encounter (Signed)
Preadmission screen  

## 2022-10-05 ENCOUNTER — Encounter (HOSPITAL_COMMUNITY): Payer: Self-pay

## 2022-10-05 ENCOUNTER — Telehealth (HOSPITAL_COMMUNITY): Payer: Self-pay | Admitting: *Deleted

## 2022-10-05 ENCOUNTER — Encounter (HOSPITAL_COMMUNITY): Payer: Self-pay | Admitting: *Deleted

## 2022-10-05 NOTE — Telephone Encounter (Signed)
Preadmission screen  

## 2022-10-07 ENCOUNTER — Ambulatory Visit: Payer: BC Managed Care – PPO

## 2022-10-09 ENCOUNTER — Other Ambulatory Visit: Payer: Self-pay

## 2022-10-09 ENCOUNTER — Encounter (HOSPITAL_COMMUNITY): Payer: Self-pay | Admitting: Obstetrics and Gynecology

## 2022-10-09 ENCOUNTER — Inpatient Hospital Stay (HOSPITAL_COMMUNITY)
Admission: RE | Admit: 2022-10-09 | Discharge: 2022-10-15 | DRG: 806 | Disposition: A | Discharge status: Home / Self Care | Payer: BC Managed Care – PPO | Attending: Obstetrics and Gynecology | Admitting: Obstetrics and Gynecology

## 2022-10-09 ENCOUNTER — Inpatient Hospital Stay (HOSPITAL_COMMUNITY): Payer: BC Managed Care – PPO

## 2022-10-09 DIAGNOSIS — J45909 Unspecified asthma, uncomplicated: Secondary | ICD-10-CM | POA: Diagnosis present

## 2022-10-09 DIAGNOSIS — A6000 Herpesviral infection of urogenital system, unspecified: Secondary | ICD-10-CM | POA: Diagnosis present

## 2022-10-09 DIAGNOSIS — O9902 Anemia complicating childbirth: Secondary | ICD-10-CM | POA: Diagnosis present

## 2022-10-09 DIAGNOSIS — Z23 Encounter for immunization: Secondary | ICD-10-CM

## 2022-10-09 DIAGNOSIS — O134 Gestational [pregnancy-induced] hypertension without significant proteinuria, complicating childbirth: Secondary | ICD-10-CM | POA: Diagnosis present

## 2022-10-09 DIAGNOSIS — O9952 Diseases of the respiratory system complicating childbirth: Secondary | ICD-10-CM | POA: Diagnosis present

## 2022-10-09 DIAGNOSIS — O133 Gestational [pregnancy-induced] hypertension without significant proteinuria, third trimester: Secondary | ICD-10-CM | POA: Diagnosis present

## 2022-10-09 DIAGNOSIS — Z86711 Personal history of pulmonary embolism: Secondary | ICD-10-CM | POA: Diagnosis not present

## 2022-10-09 DIAGNOSIS — Z3A37 37 weeks gestation of pregnancy: Secondary | ICD-10-CM

## 2022-10-09 DIAGNOSIS — O9832 Other infections with a predominantly sexual mode of transmission complicating childbirth: Secondary | ICD-10-CM | POA: Diagnosis present

## 2022-10-09 DIAGNOSIS — Z87891 Personal history of nicotine dependence: Secondary | ICD-10-CM

## 2022-10-09 LAB — COMPREHENSIVE METABOLIC PANEL
ALT: 145 U/L — ABNORMAL HIGH (ref 0–44)
AST: 65 U/L — ABNORMAL HIGH (ref 15–41)
Albumin: 2.8 g/dL — ABNORMAL LOW (ref 3.5–5.0)
Alkaline Phosphatase: 112 U/L (ref 38–126)
Anion gap: 10 (ref 5–15)
BUN: 8 mg/dL (ref 6–20)
CO2: 20 mmol/L — ABNORMAL LOW (ref 22–32)
Calcium: 8.9 mg/dL (ref 8.9–10.3)
Chloride: 102 mmol/L (ref 98–111)
Creatinine, Ser: 0.63 mg/dL (ref 0.44–1.00)
GFR, Estimated: >60 mL/min (ref >60)
Glucose, Bld: 99 mg/dL (ref 70–99)
Potassium: 3.9 mmol/L (ref 3.5–5.1)
Sodium: 132 mmol/L — ABNORMAL LOW (ref 135–145)
Total Bilirubin: 0.5 mg/dL (ref 0.3–1.2)
Total Protein: 6.3 g/dL — ABNORMAL LOW (ref 6.5–8.1)

## 2022-10-09 LAB — CBC
HCT: 30.4 % — ABNORMAL LOW (ref 36.0–46.0)
Hemoglobin: 9.8 g/dL — ABNORMAL LOW (ref 12.0–15.0)
MCH: 27.9 pg (ref 26.0–34.0)
MCHC: 32.2 g/dL (ref 30.0–36.0)
MCV: 86.6 fL (ref 80.0–100.0)
Platelets: 219 10*3/uL (ref 150–400)
RBC: 3.51 MIL/uL — ABNORMAL LOW (ref 3.87–5.11)
RDW: 19.9 % — ABNORMAL HIGH (ref 11.5–15.5)
WBC: 4.5 10*3/uL (ref 4.0–10.5)
nRBC: 0 % (ref 0.0–0.2)

## 2022-10-09 LAB — TYPE AND SCREEN
ABO/RH(D): A POS
Antibody Screen: NEGATIVE

## 2022-10-09 LAB — PROTIME-INR
INR: 0.9 (ref 0.8–1.2)
Prothrombin Time: 12.4 seconds (ref 11.4–15.2)

## 2022-10-09 LAB — APTT: aPTT: 30 seconds (ref 24–36)

## 2022-10-09 LAB — LACTATE DEHYDROGENASE: LDH: 126 U/L (ref 98–192)

## 2022-10-09 MED ORDER — LACTATED RINGERS IV SOLN: 500.0000 mL | INTRAVENOUS | Status: DC | PRN

## 2022-10-09 MED ORDER — OXYTOCIN BOLUS FROM INFUSION
333.0000 mL | Freq: Once | INTRAVENOUS | Status: AC
  Administered 2022-10-11: 333 mL via INTRAVENOUS

## 2022-10-09 MED ORDER — FENTANYL CITRATE (PF) 100 MCG/2ML IJ SOLN
50.0000 ug | INTRAMUSCULAR | Status: DC | PRN
  Administered 2022-10-10 (×5): 100 ug via INTRAVENOUS
  Administered 2022-10-10: 50 ug via INTRAVENOUS
  Administered 2022-10-10: 100 ug via INTRAVENOUS
  Filled 2022-10-09 (×8): qty 2

## 2022-10-09 MED ORDER — LACTATED RINGERS IV SOLN: INTRAVENOUS | Status: DC

## 2022-10-09 MED ORDER — LIDOCAINE HCL (PF) 1 % IJ SOLN: 30.0000 mL | INTRAMUSCULAR | Status: DC | PRN

## 2022-10-09 MED ORDER — VALACYCLOVIR HCL 500 MG PO TABS
500.0000 mg | ORAL_TABLET | Freq: Two times a day (BID) | ORAL | Status: DC
  Administered 2022-10-09 – 2022-10-15 (×12): 500 mg via ORAL
  Filled 2022-10-09 (×12): qty 1

## 2022-10-09 MED ORDER — ALBUTEROL SULFATE (2.5 MG/3ML) 0.083% IN NEBU: 2.5000 mg | INHALATION_SOLUTION | Freq: Four times a day (QID) | RESPIRATORY_TRACT | Status: DC | PRN

## 2022-10-09 MED ORDER — LABETALOL HCL 200 MG PO TABS
200.0000 mg | ORAL_TABLET | Freq: Two times a day (BID) | ORAL | Status: DC
  Administered 2022-10-09 – 2022-10-14 (×10): 200 mg via ORAL
  Filled 2022-10-09 (×10): qty 1

## 2022-10-09 MED ORDER — ACETAMINOPHEN 325 MG PO TABS
650.0000 mg | ORAL_TABLET | ORAL | Status: DC | PRN
  Administered 2022-10-11: 650 mg via ORAL
  Filled 2022-10-09: qty 2

## 2022-10-09 MED ORDER — OXYTOCIN-SODIUM CHLORIDE 30-0.9 UT/500ML-% IV SOLN
1.0000 m[IU]/min | INTRAVENOUS | Status: DC
  Administered 2022-10-10: 1 m[IU]/min via INTRAVENOUS
  Filled 2022-10-09: qty 500

## 2022-10-09 MED ORDER — ONDANSETRON HCL 4 MG/2ML IJ SOLN
4.0000 mg | Freq: Four times a day (QID) | INTRAMUSCULAR | Status: DC | PRN
  Administered 2022-10-10: 4 mg via INTRAVENOUS
  Filled 2022-10-09: qty 2

## 2022-10-09 MED ORDER — TERBUTALINE SULFATE 1 MG/ML IJ SOLN
0.2500 mg | Freq: Once | INTRAMUSCULAR | Status: AC | PRN
  Administered 2022-10-11: 0.25 mg via SUBCUTANEOUS
  Filled 2022-10-09: qty 1

## 2022-10-09 MED ORDER — OXYCODONE-ACETAMINOPHEN 5-325 MG PO TABS: 2.0000 | ORAL_TABLET | ORAL | Status: DC | PRN

## 2022-10-09 MED ORDER — OXYTOCIN-SODIUM CHLORIDE 30-0.9 UT/500ML-% IV SOLN: 2.5000 [IU]/h | INTRAVENOUS | Status: DC

## 2022-10-09 MED ORDER — MISOPROSTOL 25 MCG QUARTER TABLET
25.0000 ug | ORAL_TABLET | ORAL | Status: AC | PRN
  Administered 2022-10-09 – 2022-10-10 (×3): 25 ug via VAGINAL
  Filled 2022-10-09 (×3): qty 1

## 2022-10-09 MED ORDER — OXYCODONE-ACETAMINOPHEN 5-325 MG PO TABS: 1.0000 | ORAL_TABLET | ORAL | Status: DC | PRN

## 2022-10-09 MED ORDER — SOD CITRATE-CITRIC ACID 500-334 MG/5ML PO SOLN: 30.0000 mL | ORAL | Status: DC | PRN

## 2022-10-09 NOTE — Progress Notes (Signed)
Ultrasound to bedside. Dr Mancel Bale confirmed fetal presentation: vertex.

## 2022-10-09 NOTE — H&P (Addendum)
Wanda Fuller is a 37 y.o. female presenting for IOL d/t GHTN and h/p PE.  OB History     Gravida  2   Para      Term      Preterm      AB  1   Living  0      SAB      IAB  1   Ectopic      Multiple      Live Births  0          Past Medical History:  Diagnosis Date   Asthma    HSV-1 infection    Pregnancy induced hypertension    Pulmonary embolism (HCC)    Past Surgical History:  Procedure Laterality Date   NO PAST SURGERIES     Family History: family history includes Cancer in her maternal aunt, paternal grandmother, and paternal uncle; Hypertension in her mother. Social History:  reports that she has quit smoking. She has never used smokeless tobacco. She reports that she does not currently use alcohol. She reports that she does not use drugs.     Maternal Diabetes: No Genetic Screening: Normal Maternal Ultrasounds/Referrals: Other:Maternal echo per pt wnl, referred to hematology d/t PE in 08/2022 Fetal Ultrasounds or other Referrals:  Referred to Materal Fetal Medicine d/t h/o PE and GHTN Maternal Substance Abuse:  No Significant Maternal Medications:  Meds include: Other: lovenox '150mg'$  BID last dose 9:30pm 10/08/22, labetalol 200 mg BID, valtrex '500mg'$  BID Significant Maternal Lab Results:  Group B Strep negative  Number of Prenatal Visits:greater than 3 verified prenatal visits Other Comments:  None  Review of Systems Denies F/C/N/V/D/SOB/leg pain  History   Blood pressure 120/81, pulse 93, temperature 98.1 F (36.7 C), temperature source Oral, resp. rate 18, height '5\' 2"'$  (1.575 m), weight (!) 141.5 kg, last menstrual period 01/26/2022. Exam Physical Exam  Lungs cta CV RRR Abdomen gravid, NT  FHT 140s, + accels, - decels, moderate variability Toco none  Prenatal labs: ABO, Rh: --/--/A POS (12/25 0017) Antibody: NEG (12/25 0017) Rubella: Immune (07/20 0000) RPR: Nonreactive (07/20 0000)  HBsAg: Negative (07/20 0000)  HIV:  Non-reactive (07/20 0000)  GBS:   negative  EFW 7lbs 6oz 95% 09/30/22  Assessment/Plan: 36yo G2P0010 at 37wks being admitted for IOL d/t GHTN and h/o PE during pregnancy in 08/2022 on lovenox '150mg'$  BID last dose 9:30pm 10/08/22.  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

## 2022-10-10 ENCOUNTER — Inpatient Hospital Stay (HOSPITAL_COMMUNITY): Payer: BC Managed Care – PPO | Admitting: Anesthesiology

## 2022-10-10 LAB — COMPREHENSIVE METABOLIC PANEL
ALT: 150 U/L — ABNORMAL HIGH (ref 0–44)
ALT: 160 U/L — ABNORMAL HIGH (ref 0–44)
AST: 76 U/L — ABNORMAL HIGH (ref 15–41)
AST: 83 U/L — ABNORMAL HIGH (ref 15–41)
Albumin: 2.8 g/dL — ABNORMAL LOW (ref 3.5–5.0)
Albumin: 2.8 g/dL — ABNORMAL LOW (ref 3.5–5.0)
Alkaline Phosphatase: 119 U/L (ref 38–126)
Alkaline Phosphatase: 128 U/L — ABNORMAL HIGH (ref 38–126)
Anion gap: 8 (ref 5–15)
Anion gap: 11 (ref 5–15)
BUN: 6 mg/dL (ref 6–20)
BUN: 6 mg/dL (ref 6–20)
CO2: 20 mmol/L — ABNORMAL LOW (ref 22–32)
CO2: 18 mmol/L — ABNORMAL LOW (ref 22–32)
Calcium: 9.3 mg/dL (ref 8.9–10.3)
Calcium: 9.4 mg/dL (ref 8.9–10.3)
Chloride: 103 mmol/L (ref 98–111)
Chloride: 102 mmol/L (ref 98–111)
Creatinine, Ser: 0.62 mg/dL (ref 0.44–1.00)
Creatinine, Ser: 0.6 mg/dL (ref 0.44–1.00)
GFR, Estimated: >60 mL/min (ref >60)
GFR, Estimated: >60 mL/min (ref >60)
Glucose, Bld: 86 mg/dL (ref 70–99)
Glucose, Bld: 94 mg/dL (ref 70–99)
Potassium: 4.1 mmol/L (ref 3.5–5.1)
Potassium: 4 mmol/L (ref 3.5–5.1)
Sodium: 131 mmol/L — ABNORMAL LOW (ref 135–145)
Sodium: 131 mmol/L — ABNORMAL LOW (ref 135–145)
Total Bilirubin: 0.6 mg/dL (ref 0.3–1.2)
Total Bilirubin: 1.1 mg/dL (ref 0.3–1.2)
Total Protein: 6.4 g/dL — ABNORMAL LOW (ref 6.5–8.1)
Total Protein: 6.8 g/dL (ref 6.5–8.1)

## 2022-10-10 LAB — CBC
HCT: 32.8 % — ABNORMAL LOW (ref 36.0–46.0)
HCT: 33.1 % — ABNORMAL LOW (ref 36.0–46.0)
Hemoglobin: 10.6 g/dL — ABNORMAL LOW (ref 12.0–15.0)
Hemoglobin: 10.8 g/dL — ABNORMAL LOW (ref 12.0–15.0)
MCH: 28 pg (ref 26.0–34.0)
MCH: 28.1 pg (ref 26.0–34.0)
MCHC: 32.3 g/dL (ref 30.0–36.0)
MCHC: 32.6 g/dL (ref 30.0–36.0)
MCV: 86.8 fL (ref 80.0–100.0)
MCV: 86 fL (ref 80.0–100.0)
Platelets: 239 10*3/uL (ref 150–400)
Platelets: 225 10*3/uL (ref 150–400)
RBC: 3.78 MIL/uL — ABNORMAL LOW (ref 3.87–5.11)
RBC: 3.85 MIL/uL — ABNORMAL LOW (ref 3.87–5.11)
RDW: 20.1 % — ABNORMAL HIGH (ref 11.5–15.5)
RDW: 20 % — ABNORMAL HIGH (ref 11.5–15.5)
WBC: 5.8 10*3/uL (ref 4.0–10.5)
WBC: 7.1 10*3/uL (ref 4.0–10.5)
nRBC: 0 % (ref 0.0–0.2)
nRBC: 0 % (ref 0.0–0.2)

## 2022-10-10 LAB — LACTATE DEHYDROGENASE: LDH: 136 U/L (ref 98–192)

## 2022-10-10 LAB — PROTEIN / CREATININE RATIO, URINE
Creatinine, Urine: 139 mg/dL
Protein Creatinine Ratio: 0.12 mg/mg{Cre} (ref 0.00–0.15)
Total Protein, Urine: 17 mg/dL

## 2022-10-10 LAB — RPR: RPR Ser Ql: NONREACTIVE

## 2022-10-10 MED ORDER — PHENYLEPHRINE 80 MCG/ML (10ML) SYRINGE FOR IV PUSH (FOR BLOOD PRESSURE SUPPORT)
80.0000 ug | PREFILLED_SYRINGE | INTRAVENOUS | Status: DC | PRN
  Administered 2022-10-11 (×2): 80 ug via INTRAVENOUS

## 2022-10-10 MED ORDER — FENTANYL-BUPIVACAINE-NACL 0.5-0.125-0.9 MG/250ML-% EP SOLN: 12.0000 mL/h | EPIDURAL | Status: DC | PRN

## 2022-10-10 MED ORDER — EPHEDRINE 5 MG/ML INJ: 10.0000 mg | INTRAVENOUS | Status: DC | PRN

## 2022-10-10 MED ORDER — LACTATED RINGERS IV SOLN
500.0000 mL | Freq: Once | INTRAVENOUS | Status: AC
  Administered 2022-10-10: 500 mL via INTRAVENOUS

## 2022-10-10 MED ORDER — PHENYLEPHRINE 80 MCG/ML (10ML) SYRINGE FOR IV PUSH (FOR BLOOD PRESSURE SUPPORT)
80.0000 ug | PREFILLED_SYRINGE | INTRAVENOUS | Status: DC | PRN
  Filled 2022-10-10: qty 10

## 2022-10-10 MED ORDER — OXYTOCIN-SODIUM CHLORIDE 30-0.9 UT/500ML-% IV SOLN: 1.0000 m[IU]/min | INTRAVENOUS | Status: DC

## 2022-10-10 MED ORDER — PHENYLEPHRINE 80 MCG/ML (10ML) SYRINGE FOR IV PUSH (FOR BLOOD PRESSURE SUPPORT): 80.0000 ug | PREFILLED_SYRINGE | INTRAVENOUS | Status: DC | PRN

## 2022-10-10 MED ORDER — LACTATED RINGERS IV SOLN: 500.0000 mL | Freq: Once | INTRAVENOUS | Status: DC

## 2022-10-10 MED ORDER — FENTANYL-BUPIVACAINE-NACL 0.5-0.125-0.9 MG/250ML-% EP SOLN
12.0000 mL/h | EPIDURAL | Status: DC | PRN
  Administered 2022-10-10: 12 mL/h via EPIDURAL
  Filled 2022-10-10: qty 250

## 2022-10-10 MED ORDER — DIPHENHYDRAMINE HCL 50 MG/ML IJ SOLN: 12.5000 mg | INTRAMUSCULAR | Status: DC | PRN

## 2022-10-10 MED ORDER — SODIUM BICARBONATE 8.4 % IV SOLN
INTRAVENOUS | Status: DC | PRN
  Administered 2022-10-10: 5 mL via EPIDURAL

## 2022-10-10 NOTE — Progress Notes (Addendum)
Pt getting epidural Tracing reviewed remotely by me Cat 1 Ctxs q 2-33mn Cont to titrate pitocin per protocol as indicated Labs reviewed LFTs elevated BPs mild range latest ones s/p epidural 126/45 and 123/47 UOP 450cc/6hrs Cont to observe Per RN VE unchanged at 2040

## 2022-10-10 NOTE — Anesthesia Procedure Notes (Addendum)
Epidural Patient location during procedure: OB Start time: 10/10/2022 8:09 PM End time: 10/10/2022 8:15 PM  Staffing Anesthesiologist: Effie Berkshire, MD Performed: anesthesiologist   Preanesthetic Checklist Completed: patient identified, IV checked, site marked, risks and benefits discussed, surgical consent, monitors and equipment checked, pre-op evaluation and timeout performed  Epidural Patient position: sitting Prep: DuraPrep Patient monitoring: heart rate, continuous pulse ox and blood pressure Approach: midline Location: L3-L4 Injection technique: LOR saline  Needle:  Needle type: Tuohy  Needle gauge: 17 G Needle length: 9 cm Catheter type: closed end flexible Catheter size: 20 Guage Test dose: negative and 1.5% lidocaine  Assessment Events: blood not aspirated, no cerebrospinal fluid, injection not painful, no injection resistance and no paresthesia  Additional Notes LOR @ 7  Patient identified. Risks/Benefits/Options discussed with patient including but not limited to bleeding, infection, nerve damage, paralysis, failed block, incomplete pain control, headache, blood pressure changes, nausea, vomiting, reactions to medications, itching and postpartum back pain. Confirmed with bedside nurse the patient's most recent platelet count. Confirmed with patient that they are not currently taking any anticoagulation, have any bleeding history or any family history of bleeding disorders. Patient expressed understanding and wished to proceed. All questions were answered. Sterile technique was used throughout the entire procedure. Please see nursing notes for vital signs. Test dose was given through epidural catheter and negative prior to continuing to dose epidural or start infusion. Warning signs of high block given to the patient including shortness of breath, tingling/numbness in hands, complete motor block, or any concerning symptoms with instructions to call for help. Patient was  given instructions on fall risk and not to get out of bed. All questions and concerns addressed with instructions to call with any issues or inadequate analgesia.    Reason for block:procedure for pain

## 2022-10-10 NOTE — Progress Notes (Signed)
SCDs remain on bilaterally to lower extremities.

## 2022-10-10 NOTE — Progress Notes (Signed)
FHR 130s w/moderate variability and accelerations present. Contractions occurring every 1-2 minutes with mild relaxation noted. Patient last voided at 0500 hrs and instructed to void at this time. 500 ml output. Assisted back to bed. Toco readjusted. Contractions now occurring every 2 minutes with relaxed and adequate resting tone present. Will continue to monitor. Orders received for UPC.

## 2022-10-10 NOTE — Anesthesia Preprocedure Evaluation (Signed)
Anesthesia Evaluation  Patient identified by MRN, date of birth, ID band Patient awake    Reviewed: Allergy & Precautions, Patient's Chart, lab work & pertinent test results, reviewed documented beta blocker date and time   Airway Mallampati: III       Dental   Pulmonary asthma , former smoker - h/o PE   Pulmonary exam normal        Cardiovascular hypertension, Pt. on home beta blockers Normal cardiovascular exam     Neuro/Psych    GI/Hepatic Neg liver ROS,,,  Endo/Other    Renal/GU      Musculoskeletal   Abdominal  (+) + obese  Peds  Hematology   Anesthesia Other Findings   Reproductive/Obstetrics                             Anesthesia Physical Anesthesia Plan  ASA: 3  Anesthesia Plan: Epidural   Post-op Pain Management:    Induction:   PONV Risk Score and Plan: 0  Airway Management Planned: Natural Airway  Additional Equipment: None  Intra-op Plan:   Post-operative Plan:   Informed Consent: I have reviewed the patients History and Physical, chart, labs and discussed the procedure including the risks, benefits and alternatives for the proposed anesthesia with the patient or authorized representative who has indicated his/her understanding and acceptance.       Plan Discussed with:   Anesthesia Plan Comments: (Last lovenox dose 10/08/22  Lab Results      Component                Value               Date                      WBC                      7.1                 10/10/2022                HGB                      10.8 (L)            10/10/2022                HCT                      33.1 (L)            10/10/2022                MCV                      86.0                10/10/2022                PLT                      225                 10/10/2022           )       Anesthesia Quick Evaluation

## 2022-10-10 NOTE — Progress Notes (Addendum)
Wanda Fuller is a 37 y.o. G2P0010 at 51w1dadmitted [facility].  Subjective: Feels ctxs 8/10.  Denies HA, visual changes, abdominal pain or SOB.  Objective: BP 132/75 (BP Location: Right Arm)   Pulse 83   Temp (!) 97.5 F (36.4 C) (Oral)   Resp 18   Ht '5\' 2"'$  (1.575 m)   Wt (!) 141.5 kg   LMP 01/26/2022   BMI 57.07 kg/m  I/O last 3 completed shifts: In: -  Out: 200 [Urine:200] Total I/O In: 1163.4 [P.O.:540; I.V.:623.4] Out: 500 [Urine:500]/4hrs  FHT:  FHR: 130 bpm, variability: moderate,  accelerations:  Present,  decelerations:  Absent UC:   regular, every 2-4 minutes SVE:   Dilation: 0 Effacement (%): 50 Exam by:: AOctavio Manns MD  Labs: Lab Results  Component Value Date   WBC 5.8 10/10/2022   HGB 10.6 (L) 10/10/2022   HCT 32.8 (L) 10/10/2022   MCV 86.8 10/10/2022   PLT 239 10/10/2022    Assessment / Plan: Induction of labor due to gestational hypertension,  progressing well on pitocin s/p SROM clear fluid at 1150.  Labor:  Currently on pitocin.  Titrate per protocol as indicated.  Pt still closed.  S/p cytotec vaginally x 3.  Continuing to lead clear fluid. Preeclampsia:   GHTN with elevated LFTS, ?atypical pre-eclampsia although thought to be isolated transaminitis on admission in 08/2022.  BPs in mild range.  Adequate UOP. Fetal Wellbeing:  Category I Pain Control:  IV pain meds I/D:   GBS neg Anticipated MOD:   unsure H/o PE - asymptomatic and SCDs on LEs.  Last dose of lovenox on 10/08/21 at 2130.  ADelice Lesch MD 10/10/2022, 2:22 PM

## 2022-10-10 NOTE — Progress Notes (Signed)
Patient up to bathroom to conduct personal hygiene. Sitting down during care. Gown and linen change. No complaints of dizziness, SOB, ha or change in vision.

## 2022-10-10 NOTE — Progress Notes (Addendum)
Jamicia Raya is a 37 y.o. G2P0010 at [redacted]w[redacted]d  Subjective: Ctxs 7/10, leaking clear fluid.  Denies HA, visual changes or RUQ pain.  Objective: BP 126/77 (BP Location: Right Arm)   Pulse 90   Temp (!) 97.5 F (36.4 C) (Oral)   Resp 18   Ht '5\' 2"'$  (1.575 m)   Wt (!) 141.5 kg   LMP 01/26/2022   SpO2 94%   BMI 57.07 kg/m  I/O last 3 completed shifts: In: -  Out: 200 [Urine:200] Total I/O In: 1409.5 [P.O.:540; I.V.:869.5] Out: 850 [Urine:850]  FHT:  FHR: 130 bpm, variability: moderate,  accelerations:  Present,  decelerations:  Absent UC:   regular, every 2 minutes SVE:   Dilation: 1 Effacement (%): 70 Exam by:: Dr RMancel Bale Labs: Lab Results  Component Value Date   WBC 5.8 10/10/2022   HGB 10.6 (L) 10/10/2022   HCT 32.8 (L) 10/10/2022   MCV 86.8 10/10/2022   PLT 239 10/10/2022    Assessment / Plan: Induction of labor due to GHT and h/p PE,  progressing well on pitocin  Labor:  Progressing on pitocin s/p cytotec x 3.  Pitocin helo at 1607 d/t tachysystole and still contracting now q234m.  IUPC placed with dark blood return.  IUPC flushed.  Will cont to monitor.   MVUs about 160.  Restart pitocin at 1 mu/min and increase by 1 to MVUs of 180-220. Preeclampsia:  no signs or symptoms of toxicity.  Will recheck labs at 2000. Fetal Wellbeing:  Category I Pain Control:  IV pain meds I/D:   GBS neg Anticipated MOD:   hopeful for NSVD  AnDelice LeschMD 10/10/2022, 4:58 PM

## 2022-10-10 NOTE — Progress Notes (Addendum)
Called by RN @ [phone] with report of ctxs q91mn and cat 1 tracing Pt rating pain 6/10 s/p fentanyl Tracing reviewed remotely by me cat 1 Hold pitocin for now  Will observe BPs mild range BPs Urine PCR ordered Denies GHTN sxs and labs wnl and no SOB UOP 500cc/7hrs  0858 Urine PCR 0.12

## 2022-10-11 ENCOUNTER — Encounter (HOSPITAL_COMMUNITY): Payer: Self-pay | Admitting: Obstetrics and Gynecology

## 2022-10-11 LAB — COMPREHENSIVE METABOLIC PANEL
ALT: 126 U/L — ABNORMAL HIGH (ref 0–44)
AST: 65 U/L — ABNORMAL HIGH (ref 15–41)
Albumin: 2.3 g/dL — ABNORMAL LOW (ref 3.5–5.0)
Alkaline Phosphatase: 102 U/L (ref 38–126)
Anion gap: 9 (ref 5–15)
BUN: 7 mg/dL (ref 6–20)
CO2: 20 mmol/L — ABNORMAL LOW (ref 22–32)
Calcium: 8.9 mg/dL (ref 8.9–10.3)
Chloride: 104 mmol/L (ref 98–111)
Creatinine, Ser: 0.67 mg/dL (ref 0.44–1.00)
GFR, Estimated: >60 mL/min (ref >60)
Glucose, Bld: 122 mg/dL — ABNORMAL HIGH (ref 70–99)
Potassium: 3.6 mmol/L (ref 3.5–5.1)
Sodium: 133 mmol/L — ABNORMAL LOW (ref 135–145)
Total Bilirubin: 1.4 mg/dL — ABNORMAL HIGH (ref 0.3–1.2)
Total Protein: 5.5 g/dL — ABNORMAL LOW (ref 6.5–8.1)

## 2022-10-11 LAB — CBC
HCT: 30.3 % — ABNORMAL LOW (ref 36.0–46.0)
Hemoglobin: 9.9 g/dL — ABNORMAL LOW (ref 12.0–15.0)
MCH: 28 pg (ref 26.0–34.0)
MCHC: 32.7 g/dL (ref 30.0–36.0)
MCV: 85.8 fL (ref 80.0–100.0)
Platelets: 201 10*3/uL (ref 150–400)
RBC: 3.53 MIL/uL — ABNORMAL LOW (ref 3.87–5.11)
RDW: 19.9 % — ABNORMAL HIGH (ref 11.5–15.5)
WBC: 7.5 10*3/uL (ref 4.0–10.5)
nRBC: 0 % (ref 0.0–0.2)

## 2022-10-11 MED ORDER — OXYCODONE HCL 5 MG PO TABS
5.0000 mg | ORAL_TABLET | ORAL | Status: DC | PRN
  Administered 2022-10-12: 5 mg via ORAL
  Filled 2022-10-11: qty 1

## 2022-10-11 MED ORDER — OXYTOCIN-SODIUM CHLORIDE 30-0.9 UT/500ML-% IV SOLN: 2.5000 [IU]/h | INTRAVENOUS | Status: DC | PRN

## 2022-10-11 MED ORDER — HEPARIN SODIUM (PORCINE) 5000 UNIT/ML IJ SOLN
10000.0000 [IU] | Freq: Once | INTRAMUSCULAR | Status: AC
  Administered 2022-10-11: 10000 [IU] via SUBCUTANEOUS
  Filled 2022-10-11: qty 2

## 2022-10-11 MED ORDER — DIBUCAINE (PERIANAL) 1 % EX OINT: 1.0000 | TOPICAL_OINTMENT | CUTANEOUS | Status: DC | PRN

## 2022-10-11 MED ORDER — IBUPROFEN 600 MG PO TABS
600.0000 mg | ORAL_TABLET | Freq: Four times a day (QID) | ORAL | Status: DC
  Administered 2022-10-11 – 2022-10-15 (×18): 600 mg via ORAL
  Filled 2022-10-11 (×18): qty 1

## 2022-10-11 MED ORDER — COCONUT OIL OIL
1.0000 | TOPICAL_OIL | Status: DC | PRN
  Administered 2022-10-11: 1 via TOPICAL

## 2022-10-11 MED ORDER — OXYCODONE HCL 5 MG PO TABS: 10.0000 mg | ORAL_TABLET | ORAL | Status: DC | PRN

## 2022-10-11 MED ORDER — ACETAMINOPHEN 325 MG PO TABS: 650.0000 mg | ORAL_TABLET | ORAL | Status: DC | PRN

## 2022-10-11 MED ORDER — TETANUS-DIPHTH-ACELL PERTUSSIS 5-2.5-18.5 LF-MCG/0.5 IM SUSY
0.5000 mL | PREFILLED_SYRINGE | Freq: Once | INTRAMUSCULAR | Status: AC
  Administered 2022-10-12: 0.5 mL via INTRAMUSCULAR
  Filled 2022-10-11: qty 0.5

## 2022-10-11 MED ORDER — DIPHENHYDRAMINE HCL 25 MG PO CAPS: 25.0000 mg | ORAL_CAPSULE | Freq: Four times a day (QID) | ORAL | Status: DC | PRN

## 2022-10-11 MED ORDER — SIMETHICONE 80 MG PO CHEW: 80.0000 mg | CHEWABLE_TABLET | ORAL | Status: DC | PRN

## 2022-10-11 MED ORDER — FENTANYL CITRATE (PF) 100 MCG/2ML IJ SOLN
INTRAMUSCULAR | Status: DC | PRN
  Administered 2022-10-11 (×2): 50 ug via EPIDURAL

## 2022-10-11 MED ORDER — BENZOCAINE-MENTHOL 20-0.5 % EX AERO
1.0000 | INHALATION_SPRAY | CUTANEOUS | Status: DC | PRN
  Administered 2022-10-11: 1 via TOPICAL
  Filled 2022-10-11: qty 56

## 2022-10-11 MED ORDER — FENTANYL CITRATE (PF) 100 MCG/2ML IJ SOLN: INTRAMUSCULAR | Status: DC | PRN

## 2022-10-11 MED ORDER — SODIUM CHLORIDE 0.9 % IV SOLN
INTRAVENOUS | Status: AC
  Filled 2022-10-11: qty 5

## 2022-10-11 MED ORDER — ENOXAPARIN SODIUM 150 MG/ML IJ SOSY
150.0000 mg | PREFILLED_SYRINGE | Freq: Two times a day (BID) | INTRAMUSCULAR | Status: DC
  Administered 2022-10-12 – 2022-10-15 (×7): 150 mg via SUBCUTANEOUS
  Filled 2022-10-11 (×10): qty 1

## 2022-10-11 MED ORDER — ZOLPIDEM TARTRATE 5 MG PO TABS: 5.0000 mg | ORAL_TABLET | Freq: Every evening | ORAL | Status: DC | PRN

## 2022-10-11 MED ORDER — SENNOSIDES-DOCUSATE SODIUM 8.6-50 MG PO TABS
2.0000 | ORAL_TABLET | Freq: Every day | ORAL | Status: DC
  Administered 2022-10-12 – 2022-10-15 (×3): 2 via ORAL
  Filled 2022-10-11 (×3): qty 2

## 2022-10-11 MED ORDER — ONDANSETRON HCL 4 MG PO TABS
4.0000 mg | ORAL_TABLET | ORAL | Status: DC | PRN
  Administered 2022-10-13: 4 mg via ORAL
  Filled 2022-10-11: qty 1

## 2022-10-11 MED ORDER — PRENATAL MULTIVITAMIN CH
1.0000 | ORAL_TABLET | Freq: Every day | ORAL | Status: DC
  Administered 2022-10-11 – 2022-10-15 (×5): 1 via ORAL
  Filled 2022-10-11 (×5): qty 1

## 2022-10-11 MED ORDER — WITCH HAZEL-GLYCERIN EX PADS: 1.0000 | MEDICATED_PAD | CUTANEOUS | Status: DC | PRN

## 2022-10-11 MED ORDER — ONDANSETRON HCL 4 MG/2ML IJ SOLN: 4.0000 mg | INTRAMUSCULAR | Status: DC | PRN

## 2022-10-11 NOTE — Progress Notes (Signed)
Patient screened out for psychosocial assessment since none of the following apply: °Psychosocial stressors documented in mother or baby's chart °Gestation less than 32 weeks °Code at delivery  °Infant with anomalies °Please contact the Clinical Social Worker if specific needs arise, by MOB's request, or if MOB scores greater than 9/yes to question 10 on Edinburgh Postpartum Depression Screen. ° °Khaden Gater Boyd-Gilyard, MSW, LCSW °Clinical Social Work °[phone] °  °

## 2022-10-11 NOTE — Anesthesia Postprocedure Evaluation (Signed)
Anesthesia Post Note  Patient: Wanda Fuller  Procedure(s) Performed: AN AD HOC LABOR EPIDURAL     Patient location during evaluation: Mother Baby Anesthesia Type: Epidural Level of consciousness: awake Pain management: satisfactory to patient Vital Signs Assessment: post-procedure vital signs reviewed and stable Respiratory status: spontaneous breathing Cardiovascular status: stable Anesthetic complications: no   No notable events documented.  Last Vitals:  Vitals:   10/11/22 0848 10/11/22 1549  BP: 111/63 127/71  Pulse: 87 91  Resp: 18 18  Temp: 36.8 C 36.7 C  SpO2: 98% 99%    Last Pain:  Vitals:   10/11/22 1549  TempSrc: Oral  PainSc:    Pain Goal: Patients Stated Pain Goal: 2 (10/10/22 1430)                 Casimer Lanius

## 2022-10-11 NOTE — Progress Notes (Signed)
PPD# 0 VAVD w/ intact perineum Information for the patient's newborn:  Olamae, Ferrara Boy Braelynne [[id number]]  female   Baby Name [name] Circumcision yes, pending   S:   Reports feeling "really good" Tolerating PO fluid and solids No nausea or vomiting Bleeding is light and without clots Pain controlled with  PO meds Up ad lib / ambulatory / voiding w/o difficulty Feeding: Breast and infant in NICU on room air.      O:   VS: BP 111/63 (BP Location: Left Arm)   Pulse 87   Temp 98.2 F (36.8 C) (Oral)   Resp 18   Ht '5\' 2"'$  (1.575 m)   Wt (!) 141.5 kg   LMP 01/26/2022   SpO2 98%   BMI 57.07 kg/m   LABS:  Recent Labs    10/10/22 1934 10/11/22 0345  WBC 7.1 7.5  HGB 10.8* 9.9*  PLT 225 201   Blood type: --/--/A POS (01/13 1729) Rubella: Immune (07/20 0000)                      I&O: Intake/Output      01/14 0701 01/15 0700 01/15 0701 01/16 0700   P.O. 540    I.V. (mL/kg) 2556.3 (18.1)    Other 0    Total Intake(mL/kg) 3096.3 (21.9)    Urine (mL/kg/hr) 3200 (0.9)    Blood 303    Total Output 3503    Net -406.7         Urine Occurrence 1 x      Physical Exam: Alert and oriented X3 Lungs: Clear and unlabored Heart: regular rate and rhythm / no mumurs Abdomen: soft, non-tender, non-distended  Fundus: firm, non-tender, U-1 Perineum: intact Lochia: appropriate Extremities: 3+ pitting edema, no calf pain, tenderness, or cords    A:  PPD # 0 S/P vacuum-assisted vaginal delivery for NR fetal surveillance S/P IOL for GHTN without proteinuria    - normotensive since delivery Hx of pulmonary embolus with recurrent episode this pregnancy    -one time Heparin dose 10/11/22 @ 1700    -maintenance Lovenox 150 mg SQ begins 10/12/22 @ 0500    -follow-up with hematology after discharge    -continue Lovenox for 6 wks  P:  Routine post partum orders Start Heparin as ordered Start Lovenox as ordered SCD's while in bed Anticipate D/C on PP day 2 or 3  Plan reviewed  w/ Dr. Dr. Mancel Bale and Dr. Jethro Bastos, DNP, CNM 10/11/2022, 9:32 AM

## 2022-10-11 NOTE — Progress Notes (Signed)
CTSP due to prolonged decel.   Tracing reviewed - Prolonged decel in setting of tachysystole. Prior to my arrival, pitocin was off, and the patient has been repositioned, given IVF bolus and terbutaline.   On CE she is 9/100/+1. I did not assess position but placed and FSE.   FHT were in the 120s prior to the decel and category I tracing. FHT then dropped as low as the 70s. Recovery had happened to baseline upon placement of FSE to 120s. Had decels likely late in appearance and then heart rate to the 150s, minimal variability and had one variable decel thus far. Will allow time for recovery intrauterine resuscitation following resolution now of tachysystole.   Will continue IVF bolus and try position changes now that IUPC and FSE in place.   Dr. Rose Fillers aware and was already in route prior to my assessment.   Radene Gunning, MD Attending [place], [facility] for [facility], Onset

## 2022-10-11 NOTE — Lactation Note (Signed)
This note was copied from a baby's chart.  NICU Lactation Consultation Note  Patient Name: Wanda Fuller URKYH'C Date: 10/11/2022 Age:36 hours  Subjective Reason for consult: Initial assessment; NICU baby; Early term 37-38.6wks; 1st time breastfeeding; Primapara; Other (Comment) (AMA)  Visited with family of 95 hours old ETI NICU female; Wanda Fuller is a P1 and reports she started pumping already but "nothing" came out. Explained that the purpose of pumping this early on is mainly for breast stimulation and not to get volume, she voiced understanding. Her plan is to do both, direct breastfeeding along with pumping & bottle feeding. Ms. Ralphs voiced that if direct breastfeeding doesn't work put, she's just going to focus on advancing PO through a bottle. Assisted with hand expression (no colostrum noted) and also with the first LATCH (see LATCH score in flowsheets). Baby did a few suckles on and off before falling asleep at the breast. Reviewed lactogenesis II, pumping schedule, IDF 1/2 and anticipatory guidelines.  Objective Infant data: Mother's Current Feeding Choice: -- (NPO, Breastfeeding ad lib)  Maternal data: G2P0010  Vaginal, Vacuum (Extractor) Significant Breast History:: (+) breast changes during the pregnancy Current breast feeding challenges:: NICU admission Does the patient have breastfeeding experience prior to this delivery?: No Pumping frequency: q 3 hours (recommended) Pumped volume: 0 mL Flange Size: 27; 24 Risk factor for low milk supply:: primipara, AMA, infant separation Pump: Personal (Medela DEBP at home)  Assessment Infant: LATCH Score: 6  Feeding Status: Ad lib; NPO (Breastfeeding at lib)  Maternal: Milk volume: Normal  Intervention/Plan Interventions: Breast feeding basics reviewed; Assisted with latch; Skin to skin; Breast massage; Hand express; Breast compression; Adjust position; DEBP; Education; [employer]; Infant Driven  Feeding Algorithm education Tools: Flanges; Pump Pump Education: Setup, frequency, and cleaning; Milk Storage  Plan of care: Encouraged pumping every 3 hours, ideally 8 pumping sessions/24 hours Breast massage, hand expression and coconut oil were also encouraged prior pumping She'll continue putting baby to breast on feeding cues   No other support person at this point. All questions and concerns answered, family to contact [facility] services PRN.  Consult Status: NICU follow-up  NICU Follow-up type: New admission follow up; Maternal D/C visit   Belkys Henault Francene Boyers 10/11/2022, 11:21 AM

## 2022-10-12 MED ORDER — POLYSACCHARIDE IRON COMPLEX 150 MG PO CAPS
150.0000 mg | ORAL_CAPSULE | Freq: Every day | ORAL | Status: DC
  Administered 2022-10-12 – 2022-10-15 (×4): 150 mg via ORAL
  Filled 2022-10-12 (×4): qty 1

## 2022-10-12 NOTE — Progress Notes (Addendum)
Subjective: Postpartum Day # 1 : S/P NSVD due to pt was admitted on 1/13 for IOL at 89 weeks for GHTN with h/o PE in 08/2022, pt on '200mg'$  BID labetalol, and asymptomatic, current BP was 109/59, pt restarted Lovenox '150mg'$  SQ BID for h/o PE and will continue through 6 weeks pp, hsv+ no lesions, pt progressed with Cytotec and pitocin to VAVD @ 1/15 @ 0119 over intact perineum with EBL was 339ms with hgb drop of 10.8-9.9, on po iron and asymptomatic,, baby female went to NICU and doing well. Patient up ad lib, denies syncope or dizziness. Reports consuming regular diet without issues and denies N/V. Patient reports 0 bowel movement + passing flatus.  Denies issues with urination and reports bleeding is "lighter."  Patient is Br/BTfeeding and reports going well.  Desires undecided for postpartum contraception.  Pain is being appropriately managed with use of po meds.   No laceration Feeding:  Br/Bt Contraceptive plan:  undecided BB: Circ desires in pt circ but in NICU presently.   Objective: Vital signs in last 24 hours: Patient Vitals for the past 24 hrs:  BP Temp Temp src Pulse Resp SpO2  10/12/22 0807 (!) 144/68 97.9 F (36.6 C) Oral (!) 101 17 100 %  10/12/22 0306 (!) 109/59 98.3 F (36.8 C) Oral 96 17 100 %  10/11/22 2353 (!) 150/83 98.5 F (36.9 C) Oral (!) 101 18 100 %  10/11/22 1549 127/71 98.1 F (36.7 C) Oral 91 18 99 %     Physical Exam:  General: alert, cooperative, and appears stated age Mood/Affect: happy Lungs: clear to auscultation, no wheezes, rales or rhonchi, symmetric air entry.  Heart: normal rate, regular rhythm, normal S1, S2, no murmurs, rubs, clicks or gallops. Breast: breasts appear normal, no suspicious masses, no skin or nipple changes or axillary nodes. Abdomen:  + bowel sounds, soft, non-tender GU: perineum intact, healing well. No signs of external hematomas.  Uterine Fundus: firm Lochia: appropriate Skin: Warm, Dry. DVT Evaluation: No evidence of DVT  seen on physical exam. Negative Homan's sign. No cords or calf tenderness. No significant calf/ankle edema.     Latest Ref Rng & Units 10/11/2022    3:45 AM 10/10/2022    7:34 PM 10/10/2022   11:14 AM  CBC  WBC 4.0 - 10.5 K/uL 7.5  7.1  5.8   Hemoglobin 12.0 - 15.0 g/dL 9.9  10.8  10.6   Hematocrit 36.0 - 46.0 % 30.3  33.1  32.8   Platelets 150 - 400 K/uL 201  225  239     No results found for this or any previous visit (from the past 24 hour(s)).   CBG (last 3)  No results for input(s): "GLUCAP" in the last 72 hours.   I/O last 3 completed shifts: In: 1011 [I.V.:1011] Out: 2203 [Urine:1900; Blood:303]   Assessment Postpartum Day # 1 : S/P NSVD due to pt was admitted on 1/13 for IOL at 37 weeks for GHTN with h/o PE in 08/2022, pt on '200mg'$  BID labetalol, and asymptomatic, current BP was 109/59, pt restarted Lovenox '150mg'$  SQ BID for h/o PE and will continue through 6 weeks 99, hsv+ no lesions, pt progressed with Cytotec and pitocin to VAVD @ 1/15 @ 0119 over intact perineum with EBL was 3025m with hgb drop of 10.8-9.9, on po iron and asymptomatic, baby female went to NICU and doing well. Pt stable. -1 involution. Br/Bt feeding. Hemodynamically stable.   Plan: Continue other mgmt as ordered  H/O PE: Continue lovenox '150mg'$  SQ Q12H. Anemia: PO Iron VTE prophylactics: Early ambulated as tolerates.  Pain control: Motrin/Tylenol PRN Education given regarding options for contraception, including barrier methods, injectable contraception, IUD placement, oral contraceptives.  Plan for discharge tomorrow, Breastfeeding, Lactation consult, and Circumcision prior to discharge  Dr. Orlie Dakin to be updated on patient status  [place], FNP-C, PMHNP-BC  [address] [place], [place] [address]  Cell: [phone]  Office Phone: [phone] Fax: [phone] 10/12/2022  11:37 AM

## 2022-10-12 NOTE — Lactation Note (Signed)
This note was copied from a baby's chart.  NICU Lactation Consultation Note  Patient Name: Wanda Fuller MHWKG'[id number] Date: 10/12/2022 Age:36 hours   Subjective Reason for consult: Follow-up assessment; Primapara; 1st time breastfeeding; NICU baby; Early term 64-38.6wks Lactation followed up with Wanda Fuller on the [facility] unit. I reinforced basic pumping education and encouraged Wanda Fuller to keep going. She is not yet expressing breast milk. I provided reassurance.  I provided additional supplies for pumping on the NICU floor today. I encouraged her to hold her son, Wanda Fuller, STS and to pump afterwards, and to allow him to lick and learn, as interested.  We made a lactation appointment for NICU for Wednesday 1/17 at 1400.  Objective Infant data: Mother's Current Feeding Choice: Breast Milk and Donor Milk  Infant feeding assessment Scale for Readiness: 2 Scale for Quality: 3  Maternal data: G2P1011  Vaginal, Vacuum (Extractor) Significant Breast History:: (+) breast changes during the pregnancy  Current breast feeding challenges:: NICU  Does the patient have breastfeeding experience prior to this delivery?: No  Pumping frequency: recommended q3 hours Pumped volume: 0 mL Flange Size: 27; 24  Risk factor for low milk supply:: primipara, AMA, infant separation  Pump: Personal  Assessment Infant: LATCH Score: 6  Feeding Status: Ad lib; NPO (Breastfeeding at lib)   Maternal: Milk volume: Normal   Intervention/Plan Interventions: Breast feeding basics reviewed; Education  Tools: Pump Pump Education: Setup, frequency, and cleaning; Milk Storage  Plan: Consult Status: NICU follow-up  NICU Follow-up type: Weekly NICU follow up; New admission follow up; Verify onset of copious milk; Verify absence of engorgement    Wanda Fuller 10/12/2022, 12:55 PM

## 2022-10-13 LAB — COMPREHENSIVE METABOLIC PANEL
ALT: 108 U/L — ABNORMAL HIGH (ref 0–44)
AST: 53 U/L — ABNORMAL HIGH (ref 15–41)
Albumin: 2.6 g/dL — ABNORMAL LOW (ref 3.5–5.0)
Alkaline Phosphatase: 95 U/L (ref 38–126)
Anion gap: 9 (ref 5–15)
BUN: 6 mg/dL (ref 6–20)
CO2: 22 mmol/L (ref 22–32)
Calcium: 8.8 mg/dL — ABNORMAL LOW (ref 8.9–10.3)
Chloride: 106 mmol/L (ref 98–111)
Creatinine, Ser: 0.66 mg/dL (ref 0.44–1.00)
GFR, Estimated: >60 mL/min (ref >60)
Glucose, Bld: 84 mg/dL (ref 70–99)
Potassium: 3.7 mmol/L (ref 3.5–5.1)
Sodium: 137 mmol/L (ref 135–145)
Total Bilirubin: 0.6 mg/dL (ref 0.3–1.2)
Total Protein: 5.9 g/dL — ABNORMAL LOW (ref 6.5–8.1)

## 2022-10-13 MED ORDER — PNEUMOCOCCAL 20-VAL CONJ VACC 0.5 ML IM SUSY
0.5000 mL | PREFILLED_SYRINGE | INTRAMUSCULAR | Status: AC
  Administered 2022-10-13: 0.5 mL via INTRAMUSCULAR
  Filled 2022-10-13: qty 0.5

## 2022-10-13 MED ORDER — HYDROCHLOROTHIAZIDE 25 MG PO TABS
25.0000 mg | ORAL_TABLET | Freq: Every day | ORAL | Status: DC
  Administered 2022-10-13 – 2022-10-15 (×3): 25 mg via ORAL
  Filled 2022-10-13 (×3): qty 1

## 2022-10-13 NOTE — Progress Notes (Signed)
Post Partum Day 2 VAVD d/t fetal intolerance of labor Subjective: Pt in NICU visiting baby.  no complaints, up ad lib, voiding, tolerating PO, and denies HA, visual changes or abdominal pain.  Objective: Blood pressure (!) 144/80, pulse 91, temperature 98 F (36.7 C), temperature source Oral, resp. rate 19, height '5\' 2"'$  (1.575 m), weight (!) 141.5 kg, last menstrual period 01/26/2022, SpO2 99 %, unknown if currently breastfeeding.  Physical Exam:  General: alert and no distress Lochia: appropriate Uterine Fundus: FF, NT Incision: n/a DVT Evaluation: no calf tenderness, 1-2+ edema  Recent Labs    10/10/22 1934 10/11/22 0345  HGB 10.8* 9.9*  HCT 33.1* 30.3*    Assessment/Plan: VAVD 2 w/ GHTN on labetalol '200mg'$  and elevated BP this morning currently asymptomatic.  HCTZ ordered but pt may need an increase of labetalol.  Will cont to monitor overnight. Plan for discharge tomorrow and Circumcision done Baby likely to be discharged tomorrow or Friday per neonatology RN LFTs trending down      LOS: 4 days   Delice Lesch, MD 10/13/2022, 4:19 PM

## 2022-10-13 NOTE — Lactation Note (Signed)
This note was copied from a baby's chart.  NICU Lactation Consultation Note  Patient Name: Wanda Fuller DUKGU'R Date: 10/13/2022 Age:36 hours  Subjective Reason for consult: Follow-up assessment; Primapara; 1st time breastfeeding; NICU baby; Early term 37-38.6wks; Breastfeeding assistance  Lactation followed up with [place] and her son, [name]. RN notified me that infant has been placed on ad lib feeding status.   Laneshia has been pumping. Today she is seeing droplets of milk in her flange, which is an improvement over the first two days. When we hand expressed, beads of colostrum and transitional milk appear.   We used a rolled up blanket under the breast, and I showed Azlin how to do RPS. I noted that the areolar tissue felt "thick" with hand expression, but RPS aided in moving some fluid away from the nipple.   We placed infant in football hold on the left breast. He immediately rooted, opened his mouth, and tried to latch to the breast. He quickly slid off the nipple and released the breast. He then tried again with the same outcome. We then noted that he stopped cueing for a few minutes.   I provided a size 20 NS to help baby maintain latch. I showed Sadee how to place it correctly. Nazai latched to the breast with the NS readily and provided a few suckles and then fell asleep. We discontinued the attempt.  SLP, Desiree, accompanied me on this visit and responded to questions regarding bottle feeding. Miria's plan is to breast and bottle feed.   Lactation follow up on 1/18 would be appreciated.  Objective Infant data: Mother's Current Feeding Choice: Breast Milk and Donor Milk  Infant feeding assessment Scale for Readiness: 2 Scale for Quality: 2  Maternal data: G2P1011  Vaginal, Vacuum (Extractor) Current breast feeding challenges:: NICU  Does the patient have breastfeeding experience prior to this delivery?: No  Pumping frequency: recommend q3  hours Pumped volume: 0 mL (droplets- improvement since yesterday)  Risk factor for low milk supply:: later onset of lactogenesis II  Pump: Personal  Assessment Infant: LATCH Score: 6  Feeding Status: Ad lib  Maternal: Milk volume: Normal  Intervention/Plan Interventions: Breast feeding basics reviewed; Assisted with latch; Hand express; Adjust position; Support pillows; Education; DEBP (reverse pressure softening)  Tools: Nipple Shields Pump Education: Setup, frequency, and cleaning Nipple shield size: 20  Plan: Consult Status: NICU follow-up  NICU Follow-up type: Assist with IDF-2 (Mother does not need to pre-pump before breastfeeding)    Lenore Manner 10/13/2022, 2:44 PM

## 2022-10-14 ENCOUNTER — Ambulatory Visit: Payer: BC Managed Care – PPO

## 2022-10-14 ENCOUNTER — Encounter (HOSPITAL_COMMUNITY): Payer: Self-pay | Admitting: Obstetrics and Gynecology

## 2022-10-14 MED ORDER — LABETALOL HCL 200 MG PO TABS
200.0000 mg | ORAL_TABLET | Freq: Three times a day (TID) | ORAL | Status: DC
  Administered 2022-10-14 – 2022-10-15 (×4): 200 mg via ORAL
  Filled 2022-10-14 (×4): qty 1

## 2022-10-14 NOTE — Progress Notes (Signed)
Post Partum Day 3  Subjective: no complaints, up ad lib, voiding, tolerating PO, and + flatus  Objective: Blood pressure 131/75, pulse 82, temperature 98 F (36.7 C), temperature source Oral, resp. rate 19, height '5\' 2"'$  (1.575 m), weight (!) 141.5 kg, last menstrual period 01/26/2022, SpO2 98 %, unknown if currently breastfeeding.  Physical Exam:  General: alert, cooperative, and no distress Lochia: appropriate Uterine Fundus: firm Incision: n/a DVT Evaluation: No evidence of DVT seen on physical exam. Calf/Ankle edema is present.  No results for input(s): "HGB", "HCT" in the last 72 hours.  Assessment/Plan: Plan for discharge tomorrow Bottle feeding Baby in NICU and working on feeding, anticipate d/c for infant tomorrow Female infant s/p circumcision GHTN - labetalol 200 BID, added HCTZ '25mg'$  qd yesterday, BP remain mildly elevated today at 130s-140s/70s-80s, increased labetalol to 200 mg TID. [place] labs wnl, PCR 0.12, Liver enzymes down trending Continue Lovenox 150 mg BID 6 weeks PP for PE     LOS: 5 days   Josefine Class, MD 10/14/2022, 11:18 AM

## 2022-10-14 NOTE — Lactation Note (Signed)
This note was copied from a baby's chart.  NICU Lactation Consultation Note  Patient Name: Piccola Arico PVXYI'A Date: 10/14/2022 Age:36 days  Subjective Reason for consult: Follow-up assessment; 1st time breastfeeding; Primapara; NICU baby; Early term 28-38.6wks  Visited with family of 60 hours old ETI NICU female; Ms, Busk is a P1 and reports she's pumping but still not consistently. No OOL yet at 3 days post-partum. Baby is currently on ad lib status mainly taking bottles. Offered assistance with latch but Ms. Maltese politely declined, she said she's planning mostly to pump and bottle feeding. Let her know that this LC will be here till 1900 hours in case she changes her mind and needs latch assistance. Assisted with reverse pressure technique followed with hand expression (a few droplets noted). She still has remarkable areolar edema but it softens out after applying reverse pressure. Reviewed the importance of consistent pumping for the onset of lactogenesis II and the prevention of engorgement.   Objective Infant data: Mother's Current Feeding Choice: Breast Milk and Donor Milk  Infant feeding assessment Scale for Readiness: 1 Scale for Quality: 1  Maternal data: G2P1011  Vaginal, Vacuum (Extractor) No data recorded Current breast feeding challenges:: NICU Does the patient have breastfeeding experience prior to this delivery?: No Pumping frequency: 1 times/24 hours Pumped volume: 8 mL Flange Size: 27 Risk factor for low milk supply:: infrequent pumping as 10/14/22 Pump: Personal (Medela DEBP at home)  Assessment Infant: LATCH Score: 6  Feeding Status: Ad lib  Maternal: Milk volume: Low  Intervention/Plan Interventions: Breast feeding basics reviewed; Breast massage; Hand express; Breast compression; Coconut oil; DEBP; Education Tools: Pump; Flanges Pump Education: Setup, frequency, and cleaning; Milk Storage Nipple shield size: 20  Plan of  care: Encouraged bilateral pumping every 3 hours, ideally 8 pumping sessions/24 hours or the closest she can get to it Reverse pressure, breast massage, hand expression and coconut oil were also encouraged prior pumping She'll continue putting baby to breast on feeding cues and/or working on PO feedings via bottle   No other support person at this point. All questions and concerns answered, family to contact [facility] services PRN.   Consult Status: NICU follow-up  NICU Follow-up type: Maternal D/C visit   Athanasius Kesling Francene Boyers 10/14/2022, 5:34 PM

## 2022-10-15 ENCOUNTER — Encounter (HOSPITAL_COMMUNITY): Payer: Self-pay | Admitting: Obstetrics and Gynecology

## 2022-10-15 DIAGNOSIS — O133 Gestational [pregnancy-induced] hypertension without significant proteinuria, third trimester: Secondary | ICD-10-CM | POA: Diagnosis present

## 2022-10-15 MED ORDER — IBUPROFEN 600 MG PO TABS: 600.0000 mg | ORAL_TABLET | Freq: Four times a day (QID) | ORAL | 0 refills | Status: AC

## 2022-10-15 MED ORDER — POLYSACCHARIDE IRON COMPLEX 150 MG PO CAPS: 150.0000 mg | ORAL_CAPSULE | Freq: Every day | ORAL | 2 refills | Status: AC

## 2022-10-15 MED ORDER — HYDROCHLOROTHIAZIDE 25 MG PO TABS: 25.0000 mg | ORAL_TABLET | Freq: Every day | ORAL | 0 refills | Status: AC

## 2022-10-15 MED ORDER — ACETAMINOPHEN 325 MG PO TABS: 650.0000 mg | ORAL_TABLET | ORAL | Status: AC | PRN

## 2022-10-15 MED ORDER — OXYCODONE HCL 5 MG PO TABS: 5.0000 mg | ORAL_TABLET | Freq: Four times a day (QID) | ORAL | 0 refills | Status: DC | PRN

## 2022-10-15 MED ORDER — OXYCODONE HCL 5 MG PO TABS: 5.0000 mg | ORAL_TABLET | Freq: Four times a day (QID) | ORAL | 0 refills | Status: AC | PRN

## 2022-10-15 NOTE — Discharge Summary (Signed)
Postpartum Discharge Summary  Date of Service updated 10/15/22    Patient Name: Wanda Fuller DOB: 02/16/1986 MRN: [id number]  Date of admission: 10/09/2022 Delivery date:10/11/2022  Delivering provider: Everett Graff  Date of discharge: 10/15/2022  Admitting diagnosis: History of pulmonary embolus during pregnancy [Z86.711, Z87.59] Intrauterine pregnancy: [redacted]w[redacted]d    Secondary diagnosis:  Principal Problem:   History of pulmonary embolus during pregnancy Active Problems:   Gestational hypertension w/o significant proteinuria in 3rd trimester   SVD (spontaneous vaginal delivery)   Postpartum care following vaginal delivery  Discharge diagnosis: Term Pregnancy Delivered and Gestational Hypertension                                              Post partum procedures:started on Lovenox 150 mg SQ daily Augmentation: Pitocin and Cytotec Complications: Category III FHR surveillance after hypotension secondary to effects of epidural dosing   Hospital course: Induction of Labor With Vaginal Delivery   37y.o. yo G2P1011 at 375w2das admitted to the hospital 10/09/2022 for induction of labor.  Indication for induction: Gestational hypertension and history of pulmonary embolus with a pulmonary embolism during this pregnancy .  Patient had an labor course complicated by Cat III fetal surveillance requiring vacuum assisted delivery.   Membrane Rupture Time/Date: 11:50 AM ,10/10/2022   Delivery Method:Vaginal, Vacuum (Extractor)  Episiotomy: None  Lacerations:  None  Details of delivery can be found in separate delivery note.  Patient had a postpartum course complicated by labile blood pressures, 3+ pitting edema in lower extremities. Managed on hydrochlorothiazide and labetalol. Patient is discharged home 10/15/22 in stable condition with a consult for hematology.   Newborn Data: Birth date:10/11/2022  Birth time:1:19 AM  Gender:Female  Living status:Living  Apgars:1 ,5 ,  8 Weight:3540 g   Magnesium Sulfate received: No BMZ received: Yes Rhophylac:N/A MMR:N/A Transfusion:No  Physical exam  Vitals:   10/15/22 0351 10/15/22 0831 10/15/22 1157 10/15/22 1632  BP: (!) 140/71 129/61 130/80 129/77  Pulse: 79 90 87 88  Resp: '19 18 19 18  '$ Temp: 98 F (36.7 C) 98.2 F (36.8 C) 97.8 F (36.6 C) 98.1 F (36.7 C)  TempSrc: Oral Oral Oral Oral  SpO2: 98% 98% 100% 99%  Weight:      Height:       General: alert, cooperative, and no distress Lochia: appropriate Uterine Fundus: firm Incision: Healing well with no significant drainage DVT Evaluation: No evidence of DVT seen on physical exam. No cords or calf tenderness. Calf/Ankle edema is present Labs: Lab Results  Component Value Date   WBC 7.5 10/11/2022   HGB 9.9 (L) 10/11/2022   HCT 30.3 (L) 10/11/2022   MCV 85.8 10/11/2022   PLT 201 10/11/2022      Latest Ref Rng & Units 10/13/2022    3:12 PM  CMP  Glucose 70 - 99 mg/dL 84   BUN 6 - 20 mg/dL 6   Creatinine 0.44 - 1.00 mg/dL 0.66   Sodium 135 - 145 mmol/L 137   Potassium 3.5 - 5.1 mmol/L 3.7   Chloride 98 - 111 mmol/L 106   CO2 22 - 32 mmol/L 22   Calcium 8.9 - 10.3 mg/dL 8.8   Total Protein 6.5 - 8.1 g/dL 5.9   Total Bilirubin 0.3 - 1.2 mg/dL 0.6   Alkaline Phos 38 - 126 U/L 95  AST 15 - 41 U/L 53   ALT 0 - 44 U/L 108    Edinburgh Score:    10/12/2022   10:46 AM  Edinburgh Postnatal Depression Scale Screening Tool  I have been able to laugh and see the funny side of things. 0  I have looked forward with enjoyment to things. 0  I have blamed myself unnecessarily when things went wrong. 1  I have been anxious or worried for no good reason. 0  I have felt scared or panicky for no good reason. 0  Things have been getting on top of me. 0  I have been so unhappy that I have had difficulty sleeping. 0  I have felt sad or miserable. 0  I have been so unhappy that I have been crying. 0  The thought of harming myself has occurred to  me. 0  Edinburgh Postnatal Depression Scale Total 1      After visit meds:  Allergies as of 10/15/2022   No Known Allergies      Medication List     STOP taking these medications    valACYclovir 500 MG tablet Commonly known as: VALTREX       TAKE these medications    acetaminophen 325 MG tablet Commonly known as: TYLENOL Take 2 tablets (650 mg total) by mouth every 4 (four) hours as needed (for pain scale < 4  OR  temperature  >/=  100.5 F).   albuterol 108 (90 Base) MCG/ACT inhaler Commonly known as: VENTOLIN HFA Inhale 2 puffs into the lungs every 6 (six) hours as needed for wheezing or shortness of breath.   enoxaparin 150 MG/ML injection Commonly known as: LOVENOX Inject 1 mL (150 mg total) into the skin every 12 (twelve) hours for 21 days.   ferrous sulfate 325 (65 FE) MG tablet Take 325 mg by mouth daily with breakfast.   hydrochlorothiazide 25 MG tablet Commonly known as: HYDRODIURIL Take 1 tablet (25 mg total) by mouth daily for 7 days. Start taking on: October 16, 2022   ibuprofen 600 MG tablet Commonly known as: ADVIL Take 1 tablet (600 mg total) by mouth every 6 (six) hours.   iron polysaccharides 150 MG capsule Commonly known as: NIFEREX Take 1 capsule (150 mg total) by mouth daily. Start taking on: October 16, 2022   labetalol 200 MG tablet Commonly known as: NORMODYNE Take 1 tablet (200 mg total) by mouth 2 (two) times daily.   ONE-A-DAY WOMENS PRENATAL 1 PO Take by mouth.   oxyCODONE 5 MG immediate release tablet Commonly known as: Oxy IR/ROXICODONE Take 1 tablet (5 mg total) by mouth every 6 (six) hours as needed for up to 3 days (pain scale 4-7).               Discharge Care Instructions  (From admission, onward)           Start     Ordered   10/15/22 0000  Discharge wound care:       Comments: Clean area with soap and water and pat dry. You can leave the steri strips on until they fall off or take them off gently by  Sunday. Call the office for increased drainage, redness, pain, or warmth. Keep the incision area clean and dry at all times.   10/15/22 1639             Discharge home in stable condition Infant Feeding: Breast and formula as needed Infant Disposition:NICU with expected discharge on 10/16/22  Discharge instruction: per After Visit Summary and Postpartum booklet. Activity: Advance as tolerated. Pelvic rest for 6 weeks.  Diet: low salt diet Anticipated Birth Control:  does not want any form of contraception Postpartum Appointment:6 weeks Additional Postpartum F/U: BP check 10/18/22 Future Appointments:No future appointments. Follow up Visit:  [facility] Follow up in 1 week(s).   Specialty: Obstetrics and Gynecology Why: Return to [facility] in 1 week for BP check and then in 6 weeks for regular postpartum visit. Contact information: [address] [place]  [address] [phone]                    10/15/2022 Arrie Eastern, CNM

## 2022-10-15 NOTE — Lactation Note (Signed)
This note was copied from a baby's chart.  NICU Lactation Consultation Note  Patient Name: Wanda Fuller FWYOV'Z Date: 10/15/2022 Age:36 days   Subjective Reason for consult: Follow-up assessment; Primapara; 1st time breastfeeding; NICU baby; Early term 53-38.6wks  Lactation followed up with Wanda Fuller and infant son, Wanda Fuller. Anticipated discharge for Wanda Fuller is likely to be this weekend. He is currently ad lib feeding. Wanda Fuller is noticing some modest increases in her milk volume today that may indicate onset of lactogenesis. I recommended that she pump consistently q3 hours.  I reviewed our discharge community breastfeeding resources and recommended that she seek follow up support. She reports that she has all needed supplies at this time. I answered questions about diet and breast milk and milk volume.  Objective Infant data: Mother's Current Feeding Choice: Breast Milk and Formula  Infant feeding assessment Scale for Readiness: 1 Scale for Quality: 2  Maternal data: G2P1011  Vaginal, Vacuum (Extractor)  Current breast feeding challenges:: NICU; hyperbilirubinemia  Does the patient have breastfeeding experience prior to this delivery?: No  Pumping frequency: recommended q3 hours Pumped volume: 13 mL Flange Size: 27  Risk factor for low milk supply:: infrequent pumping as 10/14/22   Pump: Personal (Medela Pump in Style)  Assessment Infant: No data recorded Feeding Status: Ad lib   Maternal: Milk volume: Low   Intervention/Plan Interventions: Breast feeding basics reviewed; Education; [employer] brochure  Tools: Pump; Flanges Pump Education: Setup, frequency, and cleaning  Plan: Consult Status: NICU follow-up  NICU Follow-up type: Verify onset of copious milk; Verify absence of engorgement    Lenore Manner 10/15/2022, 4:22 PM

## 2022-10-17 LAB — SURGICAL PATHOLOGY

## 2022-10-20 ENCOUNTER — Telehealth (HOSPITAL_COMMUNITY): Payer: Self-pay | Admitting: *Deleted

## 2022-10-20 NOTE — Telephone Encounter (Signed)
Left phone voicemail message.  Odis Hollingshead, RN 10-20-2022 at 3:17pm

## 2022-10-22 ENCOUNTER — Telehealth (HOSPITAL_COMMUNITY): Payer: Self-pay | Admitting: *Deleted

## 2022-10-22 NOTE — Telephone Encounter (Signed)
Mom returned nurse followup call from yesterday. Reports she's feeling great with no concerns about herself. Reports baby is feeding, peeing, and pooping well.   Odis Hollingshead, [place] 10-22-2022 at 2:48pm

## 2022-10-26 ENCOUNTER — Other Ambulatory Visit: Payer: Self-pay | Admitting: Family

## 2022-10-26 DIAGNOSIS — I2699 Other pulmonary embolism without acute cor pulmonale: Secondary | ICD-10-CM

## 2022-10-26 DIAGNOSIS — D6859 Other primary thrombophilia: Secondary | ICD-10-CM

## 2022-10-26 DIAGNOSIS — Z86711 Personal history of pulmonary embolism: Secondary | ICD-10-CM

## 2022-10-27 ENCOUNTER — Inpatient Hospital Stay: Payer: BC Managed Care – PPO | Attending: Hematology & Oncology

## 2022-10-27 ENCOUNTER — Encounter: Payer: Self-pay | Admitting: Family

## 2022-10-27 ENCOUNTER — Inpatient Hospital Stay (HOSPITAL_BASED_OUTPATIENT_CLINIC_OR_DEPARTMENT_OTHER): Payer: BC Managed Care – PPO | Admitting: Family

## 2022-10-27 VITALS — BP 132/79 | HR 85 | Temp 98.8°F | Resp 17 | Wt 278.0 lb

## 2022-10-27 DIAGNOSIS — O8823 Thromboembolism in the puerperium: Secondary | ICD-10-CM | POA: Diagnosis present

## 2022-10-27 DIAGNOSIS — I2699 Other pulmonary embolism without acute cor pulmonale: Secondary | ICD-10-CM | POA: Diagnosis not present

## 2022-10-27 DIAGNOSIS — Z8759 Personal history of other complications of pregnancy, childbirth and the puerperium: Secondary | ICD-10-CM

## 2022-10-27 DIAGNOSIS — Z86711 Personal history of pulmonary embolism: Secondary | ICD-10-CM

## 2022-10-27 DIAGNOSIS — D6859 Other primary thrombophilia: Secondary | ICD-10-CM

## 2022-10-27 LAB — CBC WITH DIFFERENTIAL (CANCER CENTER ONLY)
Abs Immature Granulocytes: 0.01 10*3/uL (ref 0.00–0.07)
Basophils Absolute: 0 10*3/uL (ref 0.0–0.1)
Basophils Relative: 1 %
Eosinophils Absolute: 0.1 10*3/uL (ref 0.0–0.5)
Eosinophils Relative: 3 %
HCT: 33.5 % — ABNORMAL LOW (ref 36.0–46.0)
Hemoglobin: 10.6 g/dL — ABNORMAL LOW (ref 12.0–15.0)
Immature Granulocytes: 0 %
Lymphocytes Relative: 35 %
Lymphs Abs: 1.4 10*3/uL (ref 0.7–4.0)
MCH: 27.6 pg (ref 26.0–34.0)
MCHC: 31.6 g/dL (ref 30.0–36.0)
MCV: 87.2 fL (ref 80.0–100.0)
Monocytes Absolute: 0.4 10*3/uL (ref 0.1–1.0)
Monocytes Relative: 9 %
Neutro Abs: 2 10*3/uL (ref 1.7–7.7)
Neutrophils Relative %: 52 %
Platelet Count: 248 10*3/uL (ref 150–400)
RBC: 3.84 MIL/uL — ABNORMAL LOW (ref 3.87–5.11)
RDW: 19.2 % — ABNORMAL HIGH (ref 11.5–15.5)
WBC Count: 3.8 10*3/uL — ABNORMAL LOW (ref 4.0–10.5)
nRBC: 0 % (ref 0.0–0.2)

## 2022-10-27 LAB — ANTITHROMBIN III: AntiThromb III Func: 87 % (ref 75–120)

## 2022-10-27 LAB — CMP (CANCER CENTER ONLY)
ALT: 35 U/L (ref 0–44)
AST: 23 U/L (ref 15–41)
Albumin: 4.2 g/dL (ref 3.5–5.0)
Alkaline Phosphatase: 80 U/L (ref 38–126)
Anion gap: 9 (ref 5–15)
BUN: 9 mg/dL (ref 6–20)
CO2: 25 mmol/L (ref 22–32)
Calcium: 9.7 mg/dL (ref 8.9–10.3)
Chloride: 106 mmol/L (ref 98–111)
Creatinine: 0.65 mg/dL (ref 0.44–1.00)
GFR, Estimated: >60 mL/min (ref >60)
Glucose, Bld: 106 mg/dL — ABNORMAL HIGH (ref 70–99)
Potassium: 3.5 mmol/L (ref 3.5–5.1)
Sodium: 140 mmol/L (ref 135–145)
Total Bilirubin: 0.5 mg/dL (ref 0.3–1.2)
Total Protein: 7 g/dL (ref 6.5–8.1)

## 2022-10-27 LAB — LACTATE DEHYDROGENASE: LDH: 155 U/L (ref 98–192)

## 2022-10-27 LAB — D-DIMER, QUANTITATIVE: D-Dimer, Quant: 0.27 ug/mL-FEU (ref 0.00–0.50)

## 2022-10-27 NOTE — Progress Notes (Signed)
Hematology/Oncology Consultation   Name: Haani Bakula      MRN: [id number]    Location: Room/bed info not found  Date: 10/27/2022 Time:11:20 AM   REFERRING PHYSICIAN: [facility]  REASON FOR CONSULT: Personal history of pulmonary embolism during pregnancy   DIAGNOSIS: Bilateral pulmonary emboli during third trimester  HISTORY OF PRESENT ILLNESS: Ms. Koval 37 yo African American female with prior history of bilateral pulmonary emboli 6 years ago after returning home from a trip to [place]. She states that she was treated with Xarelto for several months and did not have repeat scans to assess for resolution.  She most recently developed a dry persistent cough while in her third trimester of pregnancy and went to the ED on 09/16/2022. CT angio confirmed bilateral pulmonary emboli and a left lower lobe pneumonia with findings suggestive of right heart strain.  She had an ECHO during admission which showed and EF of 65-70%.  Bilateral lower extremity US was negative for DVT.  She is now on Lovenox 150 mg SQ BID and tolerating nicely. No blood loss noted. No abnormal bruising, no petechiae.  She gave birth naturally on 10/11/2022 to a beautiful baby boy.  She still notes occasional chest tightness and dizziness with low blood sugar.  She is unsure about her family history of thrombotic event. She thinks she may have had a cousin that passed away from health problems that led to a thrombotic event.  Her cycle prior to pregnancy was regular with normal flow. Occasional clots noted.  No history of diabetes or thyroid disease.  She denies any past surgical history.  No known sickle cell disease or trait.  No personal history of cancer. Familial history of cancer includes: paternal grandmother with breast, paternal uncle with prostate cancer and maternal aunt with breast.  No fever, chills, n/v, cough, rash, SOB, chest pain, palpitations, abdominal pain or changes in bowel  or bladder habits.  No swelling, tenderness, numbness or tingling in her extremities.  No falls or syncope.  She quit smoking when she found out she was pregnant. No vaping.  ETOH only socially. None since pregnancy.  Appetite and hydration are good. Weight is stable at 278 lbs.  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   ROS: All other 10 point review of systems is negative.   PAST MEDICAL HISTORY:   Past Medical History:  Diagnosis Date   Asthma    CAP (community acquired pneumonia) 09/18/2016   HSV-1 infection    Pregnancy induced hypertension    Pulmonary embolism (HCC)     ALLERGIES: No Known Allergies    MEDICATIONS:  Current Outpatient Medications on File Prior to Visit  Medication Sig Dispense Refill   acetaminophen (TYLENOL) 325 MG tablet Take 2 tablets (650 mg total) by mouth every 4 (four) hours as needed (for pain scale < 4  OR  temperature  >/=  100.5 F).     albuterol (PROVENTIL HFA;VENTOLIN HFA) 108 (90 Base) MCG/ACT inhaler Inhale 2 puffs into the lungs every 6 (six) hours as needed for wheezing or shortness of breath.     enoxaparin (LOVENOX) 150 MG/ML injection Inject 1 mL (150 mg total) into the skin every 12 (twelve) hours for 21 days. 42 mL 0   ferrous sulfate 325 (65 FE) MG tablet Take 325 mg by mouth daily with breakfast.     hydrochlorothiazide (HYDRODIURIL) 25 MG tablet Take 1 tablet (25 mg total) by mouth daily for 7 days. 7 tablet 0  ibuprofen (ADVIL) 600 MG tablet Take 1 tablet (600 mg total) by mouth every 6 (six) hours. 30 tablet 0   iron polysaccharides (NIFEREX) 150 MG capsule Take 1 capsule (150 mg total) by mouth daily. 30 capsule 2   labetalol (NORMODYNE) 200 MG tablet Take 1 tablet (200 mg total) by mouth 2 (two) times daily. 60 tablet 2   Prenat-Fe Carbonyl-FA-Omega 3 (ONE-A-DAY WOMENS PRENATAL 1 PO) Take by mouth.     No current facility-administered medications on file prior to visit.     PAST SURGICAL HISTORY Past Surgical History:   Procedure Laterality Date   NO PAST SURGERIES      FAMILY HISTORY: Family History  Problem Relation Age of Onset   Hypertension Mother    Cancer Maternal Aunt    Cancer Paternal Uncle    Cancer Paternal Grandmother    Asthma Neg Hx    Diabetes Neg Hx    Heart disease Neg Hx    Stroke Neg Hx     SOCIAL HISTORY:  reports that she has quit smoking. She has never used smokeless tobacco. She reports that she does not currently use alcohol. She reports that she does not use drugs.  PERFORMANCE STATUS: The patient's performance status is 1 - Symptomatic but completely ambulatory  PHYSICAL EXAM: Most Recent Vital Signs: Last menstrual period 01/26/2022, unknown if currently breastfeeding. LMP 01/26/2022   General Appearance:    Alert, cooperative, no distress, appears stated age  Head:    Normocephalic, without obvious abnormality, atraumatic  Eyes:    PERRL, conjunctiva/corneas clear, EOM's intact, fundi    benign, both eyes        Throat:   Lips, mucosa, and tongue normal; teeth and gums normal  Neck:   Supple, symmetrical, trachea midline, no adenopathy;    thyroid:  no enlargement/tenderness/nodules; no carotid   bruit or JVD  Back:     Symmetric, no curvature, ROM normal, no CVA tenderness  Lungs:     Clear to auscultation bilaterally, respirations unlabored  Chest Wall:    No tenderness or deformity   Heart:    Regular rate and rhythm, S1 and S2 normal, no murmur, rub   or gallop     Abdomen:     Soft, non-tender, bowel sounds active all four quadrants,    no masses, no organomegaly        Extremities:   Extremities normal, atraumatic, no cyanosis or edema  Pulses:   2+ and symmetric all extremities  Skin:   Skin color, texture, turgor normal, no rashes or lesions  Lymph nodes:   Cervical, supraclavicular, and axillary nodes normal  Neurologic:   CNII-XII intact, normal strength, sensation and reflexes    throughout    LABORATORY DATA:  Results for orders placed  or performed in visit on 10/27/22 (from the past 48 hour(s))  CBC with Differential (Cancer Center Only)     Status: Abnormal   Collection Time: 10/27/22 10:49 AM  Result Value Ref Range   WBC Count 3.8 (L) 4.0 - 10.5 K/uL   RBC 3.84 (L) 3.87 - 5.11 MIL/uL   Hemoglobin 10.6 (L) 12.0 - 15.0 g/dL   HCT 33.5 (L) 36.0 - 46.0 %   MCV 87.2 80.0 - 100.0 fL   MCH 27.6 26.0 - 34.0 pg   MCHC 31.6 30.0 - 36.0 g/dL   RDW 19.2 (H) 11.5 - 15.5 %   Platelet Count 248 150 - 400 K/uL   nRBC 0.0 0.0 - 0.2 %  Neutrophils Relative % 52 %   Neutro Abs 2.0 1.7 - 7.7 K/uL   Lymphocytes Relative 35 %   Lymphs Abs 1.4 0.7 - 4.0 K/uL   Monocytes Relative 9 %   Monocytes Absolute 0.4 0.1 - 1.0 K/uL   Eosinophils Relative 3 %   Eosinophils Absolute 0.1 0.0 - 0.5 K/uL   Basophils Relative 1 %   Basophils Absolute 0.0 0.0 - 0.1 K/uL   Immature Granulocytes 0 %   Abs Immature Granulocytes 0.01 0.00 - 0.07 K/uL    Comment: Performed at [facility], [address] [place], [place] [address]      RADIOGRAPHY: No results found.     PATHOLOGY: None  ASSESSMENT/PLAN:  Ms. Eustice 37 yo [place] American female with prior history of bilateral pulmonary emboli 6 years ago after returning home from a trip to [place] treated with Xarelto for a few months.  She now has bilateral pulmonary emboli diagnosed in December 2023 during her third trimester.  She is doing well on Lovenox and will continue her same regimen per Dr. Marin Olp for at least the next 6 weeks.   Hyper coag panel is pending.  Follow-up in March with follow-up CTA that same day to re-assess for resolution of PE.   All questions were answered. The patient knows to call the clinic with any problems, questions or concerns. We can certainly see the patient much sooner if necessary.  The patient was discussed with Dr. Marin Olp and he is in agreement with the aforementioned.   Lottie Dawson, NP

## 2022-10-28 LAB — LUPUS ANTICOAGULANT PANEL
DRVVT: 44.5 s (ref 0.0–47.0)
PTT Lupus Anticoagulant: 40.9 s (ref 0.0–43.5)

## 2022-10-28 LAB — PROTEIN S, TOTAL: Protein S Ag, Total: 88 % (ref 60–150)

## 2022-10-28 LAB — PROTEIN C ACTIVITY: Protein C Activity: 133 % (ref 73–180)

## 2022-10-28 LAB — PROTEIN S ACTIVITY: Protein S Activity: 68 % (ref 63–140)

## 2022-10-28 LAB — HOMOCYSTEINE: Homocysteine: 6.5 umol/L (ref 0.0–14.5)

## 2022-10-29 LAB — BETA-2-GLYCOPROTEIN I ABS, IGG/M/A
Beta-2 Glyco I IgG: <9 GPI IgG units (ref 0–20)
Beta-2-Glycoprotein I IgA: <9 GPI IgA units (ref 0–25)
Beta-2-Glycoprotein I IgM: <9 GPI IgM units (ref 0–32)

## 2022-10-29 LAB — CARDIOLIPIN ANTIBODIES, IGG, IGM, IGA
Anticardiolipin IgA: <9 APL U/mL (ref 0–11)
Anticardiolipin IgG: <9 GPL U/mL (ref 0–14)
Anticardiolipin IgM: <9 MPL U/mL (ref 0–12)

## 2022-10-29 MED ORDER — ENOXAPARIN SODIUM 150 MG/ML IJ SOSY: 150.0000 mg | PREFILLED_SYRINGE | Freq: Two times a day (BID) | INTRAMUSCULAR | 0 refills | Status: DC

## 2022-10-30 ENCOUNTER — Telehealth (HOSPITAL_BASED_OUTPATIENT_CLINIC_OR_DEPARTMENT_OTHER): Payer: Self-pay

## 2022-10-31 LAB — PROTEIN C, TOTAL: Protein C, Total: 89 % (ref 60–150)

## 2022-11-05 LAB — FACTOR 5 LEIDEN

## 2022-11-05 LAB — PROTHROMBIN GENE MUTATION

## 2022-11-09 NOTE — Telephone Encounter (Signed)
Left voicemail with call back number to go over patient's recent hyper coag panel results which were negative.

## 2022-11-10 ENCOUNTER — Other Ambulatory Visit: Payer: Self-pay | Admitting: Family

## 2022-11-10 DIAGNOSIS — Z86711 Personal history of pulmonary embolism: Secondary | ICD-10-CM

## 2022-11-10 DIAGNOSIS — I2699 Other pulmonary embolism without acute cor pulmonale: Secondary | ICD-10-CM

## 2022-11-10 DIAGNOSIS — D6859 Other primary thrombophilia: Secondary | ICD-10-CM

## 2022-11-12 ENCOUNTER — Other Ambulatory Visit: Payer: Self-pay | Admitting: *Deleted

## 2022-11-12 DIAGNOSIS — Z8759 Personal history of other complications of pregnancy, childbirth and the puerperium: Secondary | ICD-10-CM

## 2022-11-12 DIAGNOSIS — I2699 Other pulmonary embolism without acute cor pulmonale: Secondary | ICD-10-CM

## 2022-11-12 DIAGNOSIS — D6859 Other primary thrombophilia: Secondary | ICD-10-CM

## 2022-11-12 MED ORDER — ENOXAPARIN SODIUM 150 MG/ML IJ SOSY: 150.0000 mg | PREFILLED_SYRINGE | Freq: Two times a day (BID) | INTRAMUSCULAR | 0 refills | Status: DC

## 2022-11-15 ENCOUNTER — Telehealth: Payer: Self-pay | Admitting: *Deleted

## 2022-11-15 NOTE — Telephone Encounter (Signed)
-----   Message from Celso Amy, NP sent at 11/15/2022  9:15 AM EST ----- Hyper coag panel was negative! No underlying clotting issue noted! Fenton!!!  Sarah  ----- Message ----- From: Buel Ream, [facility] [place] Sent: 10/27/2022  11:02 AM EST To: Celso Amy, NP

## 2022-11-25 ENCOUNTER — Other Ambulatory Visit: Payer: Self-pay | Admitting: *Deleted

## 2022-11-25 DIAGNOSIS — D6859 Other primary thrombophilia: Secondary | ICD-10-CM

## 2022-11-25 DIAGNOSIS — Z86711 Personal history of pulmonary embolism: Secondary | ICD-10-CM

## 2022-11-25 DIAGNOSIS — I2699 Other pulmonary embolism without acute cor pulmonale: Secondary | ICD-10-CM

## 2022-11-25 MED ORDER — ENOXAPARIN SODIUM 150 MG/ML IJ SOSY: 150.0000 mg | PREFILLED_SYRINGE | Freq: Two times a day (BID) | INTRAMUSCULAR | 0 refills | Status: AC

## 2024-02-24 ENCOUNTER — Other Ambulatory Visit: Payer: Self-pay

## 2024-02-24 ENCOUNTER — Emergency Department (HOSPITAL_COMMUNITY)
Admission: EM | Admit: 2024-02-24 | Discharge: 2024-02-24 | Disposition: A | Discharge status: Home / Self Care | Attending: Emergency Medicine | Admitting: Emergency Medicine

## 2024-02-24 ENCOUNTER — Emergency Department (HOSPITAL_COMMUNITY)

## 2024-02-24 DIAGNOSIS — R051 Acute cough: Secondary | ICD-10-CM | POA: Diagnosis not present

## 2024-02-24 DIAGNOSIS — R0789 Other chest pain: Secondary | ICD-10-CM | POA: Insufficient documentation

## 2024-02-24 DIAGNOSIS — R040 Epistaxis: Secondary | ICD-10-CM | POA: Insufficient documentation

## 2024-02-24 DIAGNOSIS — Z7901 Long term (current) use of anticoagulants: Secondary | ICD-10-CM | POA: Insufficient documentation

## 2024-02-24 DIAGNOSIS — R059 Cough, unspecified: Secondary | ICD-10-CM | POA: Diagnosis present

## 2024-02-24 DIAGNOSIS — R0602 Shortness of breath: Secondary | ICD-10-CM | POA: Diagnosis not present

## 2024-02-24 LAB — CBC
HCT: 36.9 % (ref 36.0–46.0)
Hemoglobin: 11.7 g/dL — ABNORMAL LOW (ref 12.0–15.0)
MCH: 28 pg (ref 26.0–34.0)
MCHC: 31.7 g/dL (ref 30.0–36.0)
MCV: 88.3 fL (ref 80.0–100.0)
Platelets: 230 10*3/uL (ref 150–400)
RBC: 4.18 MIL/uL (ref 3.87–5.11)
RDW: 14.9 % (ref 11.5–15.5)
WBC: 4.2 10*3/uL (ref 4.0–10.5)
nRBC: 0 % (ref 0.0–0.2)

## 2024-02-24 LAB — BASIC METABOLIC PANEL WITH GFR
Anion gap: 9 (ref 5–15)
BUN: 7 mg/dL (ref 6–20)
CO2: 23 mmol/L (ref 22–32)
Calcium: 8.8 mg/dL — ABNORMAL LOW (ref 8.9–10.3)
Chloride: 104 mmol/L (ref 98–111)
Creatinine, Ser: 0.61 mg/dL (ref 0.44–1.00)
GFR, Estimated: >60 mL/min (ref >60)
Glucose, Bld: 124 mg/dL — ABNORMAL HIGH (ref 70–99)
Potassium: 3.4 mmol/L — ABNORMAL LOW (ref 3.5–5.1)
Sodium: 136 mmol/L (ref 135–145)

## 2024-02-24 LAB — TROPONIN I (HIGH SENSITIVITY): Troponin I (High Sensitivity): <2 ng/L (ref <18)

## 2024-02-24 LAB — HCG, SERUM, QUALITATIVE: Preg, Serum: NEGATIVE

## 2024-02-24 LAB — D-DIMER, QUANTITATIVE: D-Dimer, Quant: 0.62 ug{FEU}/mL — ABNORMAL HIGH (ref 0.00–0.50)

## 2024-02-24 MED ORDER — IOHEXOL 350 MG/ML SOLN
75.0000 mL | Freq: Once | INTRAVENOUS | Status: AC | PRN
  Administered 2024-02-24: 75 mL via INTRAVENOUS

## 2024-02-24 NOTE — ED Provider Notes (Signed)
 [name] Provider Note   CSN: [id number] Arrival date & time: 02/24/24  1016     History  Chief Complaint  Patient presents with   Epistaxis   Cough    Wanda Fuller is a 38 y.o. female.  Patient with history of PE on Eliquis presents today with complaints of epistaxis, cough, shortness of breath. She states that she had a few nosebleeds yesterday, none today. Notes that she has had a dry cough for the past few days as well and some mild shortness of breath. She is concerned as she missed a few doses of Eliquis over the weekend and feels similarly to when she has had PEs previously. Does endorse some mild tightness in her chest as well. She denies fevers or chills. No leg pain or leg swelling.  The history is provided by the patient. No language interpreter was used.  Epistaxis Associated symptoms: cough   Cough Associated symptoms: shortness of breath        Home Medications Prior to Admission medications   Medication Sig Start Date End Date Taking? Authorizing Provider  acetaminophen  (TYLENOL ) 325 MG tablet Take 2 tablets (650 mg total) by mouth every 4 (four) hours as needed (for pain scale < 4  OR  temperature  >/=  100.5 F). 10/15/22   Grice, Vivian B, CNM  albuterol  (PROVENTIL  HFA;VENTOLIN  HFA) 108 (90 Base) MCG/ACT inhaler Inhale 2 puffs into the lungs every 6 (six) hours as needed for wheezing or shortness of breath.    [provider]  enoxaparin  (LOVENOX ) 150 MG/ML injection Inject 1 mL (150 mg total) into the skin every 12 (twelve) hours. 11/25/22 12/25/22  Ivor Mars, MD  ferrous sulfate  325 (65 FE) MG tablet Take 325 mg by mouth daily with breakfast.    [provider]  hydrochlorothiazide  (HYDRODIURIL ) 25 MG tablet Take 1 tablet (25 mg total) by mouth daily for 7 days. Patient not taking: Reported on 10/27/2022 10/16/22 10/23/22  Grice, Vivian B, CNM  ibuprofen  (ADVIL ) 600 MG tablet Take 1  tablet (600 mg total) by mouth every 6 (six) hours. 10/15/22   Grice, Vivian B, CNM  iron  polysaccharides (NIFEREX) 150 MG capsule Take 1 capsule (150 mg total) by mouth daily. 10/16/22 01/14/23  Grice, Vivian B, CNM  labetalol  (NORMODYNE ) 200 MG tablet Take 1 tablet (200 mg total) by mouth 2 (two) times daily. 09/20/22 12/19/22  Ogunbekun, Eboni D, MD  Prenat-Fe Carbonyl-FA-Omega 3 (ONE-A-DAY WOMENS PRENATAL 1 PO) Take by mouth. Patient not taking: Reported on 10/27/2022    [provider]  valACYclovir  (VALTREX ) 500 MG tablet Take 1 tablet by mouth 2 (two) times daily.    [provider]      Allergies    Patient has no known allergies.    Review of Systems   Review of Systems  HENT:  Positive for nosebleeds.   Respiratory:  Positive for cough and shortness of breath.   All other systems reviewed and are negative.   Physical Exam Updated Vital Signs BP (!) 128/90   Pulse 81   Temp 98.2 F (36.8 C)   Resp (!) 21   Ht 5\' 2"  (1.575 m)   Wt 124.7 kg   LMP 02/13/2024   SpO2 100%   BMI 50.30 kg/m  Physical Exam Vitals and nursing note reviewed.  Constitutional:      General: She is not in acute distress.    Appearance: Normal appearance. She is normal  weight. She is not ill-appearing, toxic-appearing or diaphoretic.  HENT:     Head: Normocephalic and atraumatic.     Nose:     Comments: No signs of bleeding within the nose    Mouth/Throat:     Comments: No signs of bleeding in the oropharynx Cardiovascular:     Rate and Rhythm: Normal rate and regular rhythm.     Heart sounds: Normal heart sounds.  Pulmonary:     Effort: Pulmonary effort is normal. No respiratory distress.     Breath sounds: Normal breath sounds.  Abdominal:     General: Abdomen is flat.     Palpations: Abdomen is soft.     Tenderness: There is no abdominal tenderness.  Musculoskeletal:        General: Normal range of motion.     Cervical back: Normal range of motion.     Right lower  leg: No edema.     Left lower leg: No edema.  Skin:    General: Skin is warm and dry.  Neurological:     General: No focal deficit present.     Mental Status: She is alert.  Psychiatric:        Mood and Affect: Mood normal.        Behavior: Behavior normal.     ED Results / Procedures / Treatments   Labs (all labs ordered are listed, but only abnormal results are displayed) Labs Reviewed  BASIC METABOLIC PANEL WITH GFR - Abnormal; Notable for the following components:      Result Value   Potassium 3.4 (*)    Glucose, Bld 124 (*)    Calcium  8.8 (*)    All other components within normal limits  CBC - Abnormal; Notable for the following components:   Hemoglobin 11.7 (*)    All other components within normal limits  D-DIMER, QUANTITATIVE - Abnormal; Notable for the following components:   D-Dimer, Quant 0.62 (*)    All other components within normal limits  HCG, SERUM, QUALITATIVE  TROPONIN I (HIGH SENSITIVITY)    EKG EKG Interpretation Date/Time:  Friday Feb 24 2024 12:46:55 EDT Ventricular Rate:  84 PR Interval:  165 QRS Duration:  94 QT Interval:  383 QTC Calculation: 453 R Axis:      Text Interpretation: Normal sinus rhythm T-wave inversion in lead II new otherwise unchanged from prior EKG Confirmed by Celesta Coke ([id number]) on 02/24/2024 12:49:48 PM  Radiology DG Chest 2 View Result Date: 02/24/2024 CLINICAL DATA:  Cough.  Epistaxis EXAM: CHEST - 2 VIEW COMPARISON:  X-ray 09/16/2022 FINDINGS: The heart size and mediastinal contours are within normal limits. No consolidation, pneumothorax or effusion. No edema. The visualized skeletal structures are unremarkable. Slight curvature of the spine. IMPRESSION: No acute cardiopulmonary disease. Electronically Signed   By: Adrianna Horde M.D.   On: 02/24/2024 12:32    Procedures Procedures    Medications Ordered in ED Medications - No data to display  ED Course/ Medical Decision Making/ A&P Clinical Course as of  02/24/24 1537  Fri Feb 24, 2024  1535 S; here to rule out PE (patient is concerned about); did have some nose bleeding; f/u CTA [WC]    Clinical Course User Index [WC] Arminda Landmark, MD                                 Medical Decision Making Amount and/or Complexity of Data Reviewed Labs: ordered.  Radiology: ordered.  Risk Prescription drug management.   This patient is a 38 y.o. female who presents to the ED for concern of epistaxis, cough, shortness of breath, this involves an extensive number of treatment options, and is a complaint that carries with it a high risk of complications and morbidity. The emergent differential diagnosis prior to evaluation includes, but is not limited to,  ACS/PE, pneumonia . This is not an exhaustive differential.   Past Medical History / Co-morbidities / Social History:  has a past medical history of Asthma, CAP (community acquired pneumonia) (09/18/2016), HSV-1 infection, Pregnancy induced hypertension, and Pulmonary embolism (HCC).  Additional history: Chart reviewed. Pertinent results include: Had initial PE 7 years ago, was started on Xarelto , had second PE during pregnancy in 2023, was transitioned to Lovenox , and then to Eliquis after she gave birth. Has had negative d dimer previously.  Physical Exam: Physical exam performed. The pertinent findings include: well appearing, no signs of active epistaxis, speaking in complete sentences on room air  Lab Tests: I ordered, and personally interpreted labs.  The pertinent results include: K 3.4, troponin <2, d dimer 0.62   Imaging Studies: I ordered imaging studies including CXR, CTA PE. I independently visualized and interpreted imaging which showed   CXR: NAD  I agree with the radiologist interpretation.  CTA pending at shift change.    Cardiac Monitoring:  The patient was maintained on a cardiac monitor.  My attending physician Dr. Nora Beal viewed and interpreted the cardiac monitored  which showed an underlying rhythm of: sinus rhythm, no STEMI. I agree with this interpretation.   Disposition:  Patients CTA is pending at shift change, will determine dispo. If negative, suspect she can be discharged home. Discussed using Afrin spray and direct pressure if issues with epistaxis continue. Nose blowing precautions given as well. She has not had any issues with this today  Care handoff to Arminda Landmark, MD at shift change. Please see their note for continued evaluation and dispo  Final Clinical Impression(s) / ED Diagnoses Final diagnoses:  None    Rx / DC Orders ED Discharge Orders     None         Kathrin Folden A, PA-C 02/25/24 [id number]    Kingsley, Victoria K, DO 03/01/24 1557

## 2024-02-24 NOTE — Discharge Instructions (Signed)
 Wanda Fuller:  Thank you for allowing us  to take care of you today.  We hope you begin feeling better soon. You were seen today for nose bleeding, concern for clot in your lung.  The CT revealed no evidence of a clot in your lungs.  There is a finding in your liver which is consistent with hemangioma.  You need to follow-up with your primary care doctor regarding this if this is unknown.  Please return the emergency department for chest pain, shortness of breath, nosebleeds that will not stop after pressure for 15 minutes.  To-Do:  Please follow-up with your primary doctor within the next 2-3 days. It is important that you review any labs or imaging results (if any) that you had today with them. Your preliminary imaging results (if any) are attached. Please return to the Emergency Department or call 911 if you experience chest pain, shortness of breath, severe pain, severe fever, altered mental status, or have any reason to think that you need emergency medical care.  Thank you again.  Hope you feel better soon.  Arminda Landmark, MD Department of Emergency Medicine

## 2024-02-24 NOTE — ED Triage Notes (Signed)
 Pt. Stated, I had 4 nose bleeds yesterday , last nose bleed was a few years back. Ive had a dry cough and runny nose with a headache. I do take Elaquis from previous blood clots.

## 2024-02-24 NOTE — ED Provider Notes (Signed)
 Assume Care - Medical Decision Making  Care of patient assumed from previous emergency medicine provider. See their note for further details of history, physical exam and plan.  Briefly, Wanda Fuller is a 38 y.o. female who presents as below:  Clinical Course as of 02/24/24 1538  Fri Feb 24, 2024  1535 S; here to rule out PE (patient is concerned about); did have some nose bleeding; f/u CTA [WC]    Clinical Course User Index [WC] Arminda Landmark, MD     Reassessment:  I personally reassessed the patient: Vital Signs:  The most current vitals were:  Vitals:   02/24/24 1023 02/24/24 1430  BP: (!) 151/101 (!) 128/90  Pulse: 95 81  Resp: 19 (!) 21  Temp: 98.2 F (36.8 C)   SpO2: 98% 100%     Hemodynamics:  The patient is hemodynamically stable. Mental Status:  The patient is alert   Additional MDM/ED Course: On review of patient imaging patient has no concerns for PE.  Patient does have findings suggestive of a hemangioma in the liver.  I have placed this in the after visit summary as well as discussed this with the patient.  Patient appears overall well and states that she would like to be discharged.  I think this is reasonable.  She has no further epistaxis here in the emergency department.  Her hemoglobin stable.  Patient stable for discharge.  Follow with PCP.   Arminda Landmark, MD 02/24/24 Darrick Embs    Sueellen Emery, MD 02/24/24 [phone]

## 2024-02-24 NOTE — ED Notes (Signed)
 Patient transported to CT

## 2024-02-24 NOTE — ED Provider Triage Note (Signed)
 Emergency Medicine Provider Triage Evaluation Note  [facility] , a 38 y.o. female  was evaluated in triage.  Pt complains of nosebleed and cough..  Review of Systems  Positive: Nosebleed cough Negative: Fevers  Physical Exam  BP (!) 151/101 (BP Location: Right Arm)   Pulse 95   Temp 98.2 F (36.8 C)   Resp 19   Ht 5\' 2"  (1.575 m)   Wt 124.7 kg   LMP 02/13/2024   SpO2 98%   BMI 50.30 kg/m  No active bleeding from nose.  Medical Decision Making  Medically screening exam initiated at 10:55 AM.  Appropriate orders placed.  Jemya Lish was informed that the remainder of the evaluation will be completed by another provider, this initial triage assessment does not replace that evaluation, and the importance of remaining in the ED until their evaluation is complete.  Patient with nosebleed.  On anticoagulation to previous pulmonary embolism.  Has had bleeding out of both nostrils.  Also has been going down the back of throat.  Has been felt lightheaded and dizzy.  Also cough.  Some sputum production.  Will get x-ray and basic blood work.    Mozell Arias, MD 02/24/24 1056
